# Patient Record
Sex: Male | Born: 1950 | Race: Black or African American | Hispanic: No | Marital: Single | Smoking: Former smoker
Health system: Southern US, Community
[De-identification: ages and names within clinical notes are randomized; demographics above are authoritative.]

## PROBLEM LIST (undated history)

## (undated) DIAGNOSIS — G9389 Other specified disorders of brain: Secondary | ICD-10-CM

## (undated) DIAGNOSIS — N1831 Chronic kidney disease, stage 3a: Secondary | ICD-10-CM

## (undated) DIAGNOSIS — R519 Headache, unspecified: Secondary | ICD-10-CM

## (undated) DIAGNOSIS — F17200 Nicotine dependence, unspecified, uncomplicated: Secondary | ICD-10-CM

## (undated) DIAGNOSIS — D126 Benign neoplasm of colon, unspecified: Secondary | ICD-10-CM

## (undated) DIAGNOSIS — I10 Essential (primary) hypertension: Secondary | ICD-10-CM

## (undated) DIAGNOSIS — K862 Cyst of pancreas: Secondary | ICD-10-CM

## (undated) DIAGNOSIS — E7800 Pure hypercholesterolemia, unspecified: Secondary | ICD-10-CM

## (undated) DIAGNOSIS — K861 Other chronic pancreatitis: Secondary | ICD-10-CM

## (undated) HISTORY — DX: Other chronic pancreatitis: K86.1

## (undated) HISTORY — DX: Cyst of pancreas: K86.2

## (undated) HISTORY — DX: Nicotine dependence, unspecified, uncomplicated: F17.200

## (undated) HISTORY — DX: Pure hypercholesterolemia, unspecified: E78.00

---

## 2007-10-25 ENCOUNTER — Emergency Department: Payer: Self-pay | Admitting: Emergency Medicine

## 2010-12-21 ENCOUNTER — Emergency Department: Payer: Self-pay | Admitting: Emergency Medicine

## 2013-07-22 ENCOUNTER — Ambulatory Visit: Payer: Self-pay | Admitting: Family Medicine

## 2017-06-05 ENCOUNTER — Other Ambulatory Visit: Payer: Self-pay | Admitting: Nurse Practitioner

## 2017-06-05 DIAGNOSIS — Z136 Encounter for screening for cardiovascular disorders: Secondary | ICD-10-CM

## 2017-06-05 DIAGNOSIS — Z87891 Personal history of nicotine dependence: Secondary | ICD-10-CM

## 2017-06-18 ENCOUNTER — Ambulatory Visit
Admission: RE | Admit: 2017-06-18 | Discharge: 2017-06-18 | Disposition: A | Discharge status: Home / Self Care | Payer: Medicare HMO | Source: Ambulatory Visit | Attending: Nurse Practitioner | Admitting: Nurse Practitioner

## 2017-06-18 DIAGNOSIS — Z136 Encounter for screening for cardiovascular disorders: Secondary | ICD-10-CM

## 2017-06-18 DIAGNOSIS — Z87891 Personal history of nicotine dependence: Secondary | ICD-10-CM

## 2019-05-12 ENCOUNTER — Ambulatory Visit: Payer: Self-pay | Attending: Internal Medicine

## 2019-05-12 DIAGNOSIS — Z23 Encounter for immunization: Secondary | ICD-10-CM | POA: Insufficient documentation

## 2019-05-12 NOTE — Progress Notes (Signed)
   Covid-19 Vaccination Clinic  Name:  Patrick Mack    MRN: [id number] DOB: 03/15/1951  05/12/2019  [name] was observed post Covid-19 immunization for 15 minutes without incidence. He was provided with Vaccine Information Sheet and instruction to access the V-Safe system.   [name] was instructed to call 911 with any severe reactions post vaccine: Marland Kitchen Difficulty breathing  . Swelling of your face and throat  . A fast heartbeat  . A bad rash all over your body  . Dizziness and weakness    Immunizations Administered    Name Date Dose VIS Date Route   Pfizer COVID-19 Vaccine 05/12/2019 11:15 AM 0.3 mL 02/25/2019 Intramuscular   Manufacturer: [place]   Lot: [id number]   [place]: [id number]

## 2019-06-01 ENCOUNTER — Ambulatory Visit: Payer: Self-pay | Attending: Internal Medicine

## 2019-06-01 DIAGNOSIS — Z23 Encounter for immunization: Secondary | ICD-10-CM

## 2019-06-01 NOTE — Progress Notes (Signed)
   Covid-19 Vaccination Clinic  Name:  Patrick Mack    MRN: [id number] DOB: [birth year]  06/01/2019  [name] was observed post Covid-19 immunization for 15 minutes without incident. He was provided with Vaccine Information Sheet and instruction to access the V-Safe system.   [name] was instructed to call 911 with any severe reactions post vaccine: Marland Kitchen Difficulty breathing  . Swelling of face and throat  . A fast heartbeat  . A bad rash all over body  . Dizziness and weakness   Immunizations Administered    Name Date Dose VIS Date Route   Pfizer COVID-19 Vaccine 06/01/2019 12:52 PM 0.3 mL 02/25/2019 Intramuscular   Manufacturer: [place]   Lot: [id number]   [place]: [id number]

## 2019-12-26 ENCOUNTER — Encounter (HOSPITAL_COMMUNITY): Payer: Self-pay | Admitting: Gastroenterology

## 2019-12-26 ENCOUNTER — Other Ambulatory Visit: Payer: Self-pay | Admitting: Gastroenterology

## 2019-12-26 ENCOUNTER — Other Ambulatory Visit: Payer: Self-pay

## 2019-12-27 ENCOUNTER — Other Ambulatory Visit (HOSPITAL_COMMUNITY)
Admission: RE | Admit: 2019-12-27 | Discharge: 2019-12-27 | Disposition: A | Discharge status: Home / Self Care | Payer: Medicare HMO | Source: Ambulatory Visit | Attending: Gastroenterology | Admitting: Gastroenterology

## 2019-12-27 DIAGNOSIS — Z01812 Encounter for preprocedural laboratory examination: Secondary | ICD-10-CM | POA: Insufficient documentation

## 2019-12-27 DIAGNOSIS — Z20822 Contact with and (suspected) exposure to covid-19: Secondary | ICD-10-CM | POA: Insufficient documentation

## 2019-12-27 LAB — SARS CORONAVIRUS 2 (TAT 6-24 HRS): SARS Coronavirus 2: NEGATIVE

## 2019-12-30 ENCOUNTER — Other Ambulatory Visit: Payer: Self-pay

## 2019-12-30 ENCOUNTER — Encounter (HOSPITAL_COMMUNITY)
Admission: RE | Disposition: A | Discharge status: Home / Self Care | Payer: Self-pay | Source: Home / Self Care | Attending: Gastroenterology

## 2019-12-30 ENCOUNTER — Ambulatory Visit (HOSPITAL_COMMUNITY): Payer: Medicare HMO | Admitting: Anesthesiology

## 2019-12-30 ENCOUNTER — Encounter (HOSPITAL_COMMUNITY): Payer: Self-pay | Admitting: Gastroenterology

## 2019-12-30 ENCOUNTER — Ambulatory Visit (HOSPITAL_COMMUNITY)
Admission: RE | Admit: 2019-12-30 | Discharge: 2019-12-30 | Disposition: A | Discharge status: Home / Self Care | Payer: Medicare HMO | Attending: Gastroenterology | Admitting: Gastroenterology

## 2019-12-30 DIAGNOSIS — N1831 Chronic kidney disease, stage 3a: Secondary | ICD-10-CM | POA: Diagnosis not present

## 2019-12-30 DIAGNOSIS — Q6101 Congenital single renal cyst: Secondary | ICD-10-CM | POA: Insufficient documentation

## 2019-12-30 DIAGNOSIS — I129 Hypertensive chronic kidney disease with stage 1 through stage 4 chronic kidney disease, or unspecified chronic kidney disease: Secondary | ICD-10-CM | POA: Diagnosis not present

## 2019-12-30 DIAGNOSIS — K8689 Other specified diseases of pancreas: Secondary | ICD-10-CM | POA: Insufficient documentation

## 2019-12-30 DIAGNOSIS — Z87891 Personal history of nicotine dependence: Secondary | ICD-10-CM | POA: Insufficient documentation

## 2019-12-30 HISTORY — DX: Other specified disorders of brain: G93.89

## 2019-12-30 HISTORY — DX: Essential (primary) hypertension: I10

## 2019-12-30 HISTORY — PX: UPPER ESOPHAGEAL ENDOSCOPIC ULTRASOUND (EUS): SHX6562

## 2019-12-30 HISTORY — DX: Headache, unspecified: R51.9

## 2019-12-30 HISTORY — DX: Benign neoplasm of colon, unspecified: D12.6

## 2019-12-30 HISTORY — DX: Chronic kidney disease, stage 3a: N18.31

## 2019-12-30 HISTORY — PX: ESOPHAGOGASTRODUODENOSCOPY (EGD) WITH PROPOFOL: SHX5813

## 2019-12-30 HISTORY — PX: FINE NEEDLE ASPIRATION: SHX5430

## 2019-12-30 SURGERY — ESOPHAGOGASTRODUODENOSCOPY (EGD) WITH PROPOFOL
Anesthesia: Monitor Anesthesia Care

## 2019-12-30 MED ORDER — PROPOFOL 10 MG/ML IV BOLUS: INTRAVENOUS | Status: DC | PRN

## 2019-12-30 MED ORDER — SODIUM CHLORIDE 0.9 % IV SOLN: INTRAVENOUS | Status: DC

## 2019-12-30 MED ORDER — FENTANYL CITRATE (PF) 100 MCG/2ML IJ SOLN
INTRAMUSCULAR | Status: DC | PRN
  Administered 2019-12-30: 25 ug via INTRAVENOUS

## 2019-12-30 MED ORDER — LACTATED RINGERS IV SOLN: INTRAVENOUS | Status: DC | PRN

## 2019-12-30 MED ORDER — PROPOFOL 10 MG/ML IV BOLUS
INTRAVENOUS | Status: DC | PRN
  Administered 2019-12-30 (×2): 30 mg via INTRAVENOUS

## 2019-12-30 MED ORDER — FENTANYL CITRATE (PF) 100 MCG/2ML IJ SOLN
INTRAMUSCULAR | Status: AC
Start: 2019-12-30 — End: ?
  Filled 2019-12-30: qty 2

## 2019-12-30 MED ORDER — CIPROFLOXACIN IN D5W 400 MG/200ML IV SOLN
INTRAVENOUS | Status: AC
  Filled 2019-12-30: qty 200

## 2019-12-30 MED ORDER — PROPOFOL 500 MG/50ML IV EMUL
INTRAVENOUS | Status: DC | PRN
  Administered 2019-12-30: 150 ug/kg/min via INTRAVENOUS

## 2019-12-30 MED ORDER — CIPROFLOXACIN IN D5W 400 MG/200ML IV SOLN
INTRAVENOUS | Status: DC | PRN
  Administered 2019-12-30: 400 mg via INTRAVENOUS

## 2019-12-30 MED ORDER — PHENYLEPHRINE HCL (PRESSORS) 10 MG/ML IV SOLN
INTRAVENOUS | Status: DC | PRN
  Administered 2019-12-30 (×3): 80 ug via INTRAVENOUS

## 2019-12-30 NOTE — Anesthesia Procedure Notes (Signed)
Procedure Name: MAC Date/Time: 12/30/2019 2:01 PM Performed by: Raenette Rover, CRNA Pre-anesthesia Checklist: Patient identified, Emergency Drugs available, Suction available and Patient being monitored Patient Re-evaluated:Patient Re-evaluated prior to induction Oxygen Delivery Method: Simple face mask

## 2019-12-30 NOTE — Anesthesia Postprocedure Evaluation (Signed)
Anesthesia Post Note  Patient: Patrick Mack  Procedure(s) Performed: UPPER ESOPHAGEAL ENDOSCOPIC ULTRASOUND (EUS) (N/A ) ESOPHAGOGASTRODUODENOSCOPY (EGD) WITH PROPOFOL (N/A ) FINE NEEDLE ASPIRATION (FNA) LINEAR (N/A )     Patient location during evaluation: PACU Anesthesia Type: MAC Level of consciousness: awake and alert Pain management: pain level controlled Vital Signs Assessment: post-procedure vital signs reviewed and stable Respiratory status: spontaneous breathing, nonlabored ventilation, respiratory function stable and patient connected to nasal cannula oxygen Cardiovascular status: stable and blood pressure returned to baseline Postop Assessment: no apparent nausea or vomiting Anesthetic complications: no   No complications documented.  Last Vitals:  Vitals:   12/30/19 1500 12/30/19 1510  BP: 112/85 110/61  Pulse: 79 62  Resp: (!) 22 (!) 21  Temp:    SpO2: 95% 100%    Last Pain:  Vitals:   12/30/19 1510  TempSrc:   PainSc: 0-No pain                 Jaylianna Tatlock DAVID

## 2019-12-30 NOTE — Anesthesia Preprocedure Evaluation (Addendum)
Anesthesia Evaluation  Patient identified by MRN, date of birth, ID band Patient awake    Reviewed: Allergy & Precautions, NPO status , Patient's Chart, lab work & pertinent test results  Airway Mallampati: I  TM Distance: >3 FB Neck ROM: Full    Dental   Pulmonary neg pulmonary ROS,    Pulmonary exam normal        Cardiovascular hypertension, Pt. on medications Normal cardiovascular exam     Neuro/Psych  Headaches, negative psych ROS   GI/Hepatic Neg liver ROS,   Endo/Other  negative endocrine ROS  Renal/GU Renal InsufficiencyRenal diseaseCKD III  negative genitourinary   Musculoskeletal   Abdominal   Peds  Hematology   Anesthesia Other Findings   Reproductive/Obstetrics                            Anesthesia Physical Anesthesia Plan  ASA: II  Anesthesia Plan: MAC   Post-op Pain Management:    Induction:   PONV Risk Score and Plan: 1  Airway Management Planned: Natural Airway and Mask  Additional Equipment: None  Intra-op Plan:   Post-operative Plan:   Informed Consent: I have reviewed the patients History and Physical, chart, labs and discussed the procedure including the risks, benefits and alternatives for the proposed anesthesia with the patient or authorized representative who has indicated his/her understanding and acceptance.       Plan Discussed with: CRNA and Surgeon  Anesthesia Plan Comments:        Anesthesia Quick Evaluation

## 2019-12-30 NOTE — Op Note (Signed)
[facility] Patient Name: Patrick Mack Procedure Date: 12/30/2019 MRN: [id number] Attending MD: Carol Ada , MD Date of Birth: Aug 28, 1950 CSN: [id number] Age: 69 Admit Type: Outpatient Procedure:                Upper EUS Indications:              Dilated pancreatic duct on CT scan Providers:                Carol Ada, MD, Baird Cancer, RN, Cherylynn Ridges,                            Technician, Hedy Camara CRNA Referring MD:              Medicines:                Propofol per Anesthesia Complications:            No immediate complications. Estimated Blood Loss:     Estimated blood loss: none. Procedure:                Pre-Anesthesia Assessment:                           - Prior to the procedure, a History and Physical                            was performed, and patient medications and                            allergies were reviewed. The patient's tolerance of                            previous anesthesia was also reviewed. The risks                            and benefits of the procedure and the sedation                            options and risks were discussed with the patient.                            All questions were answered, and informed consent                            was obtained. Prior Anticoagulants: The patient has                            taken no previous anticoagulant or antiplatelet                            agents. ASA Grade Assessment: II - A patient with                            mild systemic disease. After reviewing the risks  and benefits, the patient was deemed in                            satisfactory condition to undergo the procedure.                           - Sedation was administered by an anesthesia                            professional. Deep sedation was attained.                           After obtaining informed consent, the endoscope was                            passed under  direct vision. Throughout the                            procedure, the patient's blood pressure, pulse, and                            oxygen saturations were monitored continuously. The                            [id number] ([id number] Olympus Linear EUS was                            introduced through the mouth, and advanced to the                            second part of duodenum. The upper EUS was                            accomplished without difficulty. The patient                            tolerated the procedure well. Scope In: Scope Out: Findings:      ENDOSONOGRAPHIC FINDING: :      An anechoic lesion suggestive of a cyst was identified in the pancreatic       head and uncinate process of the pancreas. It is not in obvious       communication with the pancreatic duct. The lesion measured 13 mm by 18       mm in maximal cross-sectional diameter. There was a single compartment       without septae. The outer wall of the lesion was thin. There was no       associated mass. There was internal debris within the fluid-filled       cavity. Diagnostic needle aspiration for fluid was performed. Color       Doppler imaging was utilized prior to needle puncture to confirm a lack       of significant vascular structures within the needle path. One pass was       made with the 22 gauge needle using a transduodenal approach. No stylet       was used. The amount of  fluid collected was 1 mL. The fluid was clear,       white and thin. Sample(s) were sent for cytology and CEA.      The pancreatic duct had a dilated endosonographic appearance and had       intraductal stones in the pancreatic head, body of the pancreas and side       branches of the pancreatic duct. The pancreatic duct measured up to 7 mm       in diameter.      Pancreatic parenchymal abnormalities were noted in the pancreatic head,       pancreatic body and uncinate process of the pancreas. These consisted of       atrophy,  calcifications, diffuse echogenicity and lobularity.      An anechoic lesion suggestive of a cyst was identified in the left       kidney. There was a single compartment without septae. The outer wall of       the lesion was thin. There was no associated mass. There was no internal       debris within the fluid-filled cavity.      The head, uncinate, and body of the pancreas was hypoechic and lobular.       The tail of the pancreas appeared to be normal, but it was difficult to       obtain a complete view. The PD was markedly dilated in the head, neck,       and the body of the pancreas. Two proximal PD stones were identified and       one appeared to be intraductal. There were dilated side banches. The       luminal view of the ampulla was briefly visualized, but there was no       evidence of any mucus extruding from the ampullary orifice to suggest an       IPMN. There was no evidence of any masses in the head of the pancreas or       uncinate process, but an 18 x 13 mm cyst was identified. There appeared       to be some sediment in the cyst, but no evidence of any nodularity or       associated masses. The fluid was easily aspirated and it appeared thin       and clear. The cyst was completely collapsed. Cipro 400 mg IV was       provided to the patient at the time of the fluid aspiration. The CBD was       normal in caliber measuring 3 mm and the gallbladder was distended       without stones or sludge. A benign cyst was found in the left kidney. Impression:               - A cystic lesion was seen in the pancreatic head                            and uncinate process of the pancreas. Fine needle                            aspiration for fluid performed.                           - The pancreatic duct had a dilated endosonographic  appearance and had intraductal stones in the                            pancreatic head, body of the pancreas and side                             branches of the pancreatic duct. The pancreatic                            duct measured up to 7 mm in diameter.                           - Pancreatic parenchymal abnormalities consisting                            of atrophy, calcifications, diffuse echogenicity                            and lobularity were noted in the pancreatic head,                            pancreatic body and uncinate process of the                            pancreas.                           - A cystic lesion was identified in the left kidney. Moderate Sedation:      Not Applicable - Patient had care per Anesthesia. Recommendation:           - Patient has a contact number available for                            emergencies. The signs and symptoms of potential                            delayed complications were discussed with the                            patient. Return to normal activities tomorrow.                            Written discharge instructions were provided to the                            patient.                           - Resume regular diet.                           - Await cytology results, tumor marker (CEA), and                            amylase.                           -  Complete the three day course of Cipro. Procedure Code(s):        --- Professional ---                           [phone], Esophagogastroduodenoscopy, flexible,                            transoral; with transendoscopic ultrasound-guided                            intramural or transmural fine needle                            aspiration/biopsy(s), (includes endoscopic                            ultrasound examination limited to the esophagus,                            stomach or duodenum, and adjacent structures) Diagnosis Code(s):        --- Professional ---                           X43.5, Cyst of pancreas                           K86.9, Disease of pancreas, unspecified                            N28.89, Other specified disorders of kidney and                            ureter                           K86.89, Other specified diseases of pancreas                           R93.3, Abnormal findings on diagnostic imaging of                            other parts of digestive tract CPT copyright 2019 [employer]. All rights reserved. The codes documented in this report are preliminary and upon coder review may  be revised to meet current compliance requirements. Carol Ada, MD Carol Ada, MD 12/30/2019 3:02:33 PM This report has been signed electronically. Number of Addenda: 0

## 2019-12-30 NOTE — Discharge Instructions (Signed)
Upper Endoscopy, Adult, Care After  This sheet gives you information about how to care for yourself after your procedure. Your health care provider may also give you more specific instructions. If you have problems or questions, contact your health care provider.  What can I expect after the procedure?  After the procedure, it is common to have:  · A sore throat.  · Mild stomach pain or discomfort.  · Bloating.  · Nausea.  Follow these instructions at home:    · Follow instructions from your health care provider about what to eat or drink after your procedure.  · Return to your normal activities as told by your health care provider. Ask your health care provider what activities are safe for you.  · Take over-the-counter and prescription medicines only as told by your health care provider.  · Do not drive for 24 hours if you were given a sedative during your procedure.  · Keep all follow-up visits as told by your health care provider. This is important.  Contact a health care provider if you have:  · A sore throat that lasts longer than one day.  · Trouble swallowing.  Get help right away if:  · You vomit blood or your vomit looks like coffee grounds.  · You have:  ? A fever.  ? Bloody, black, or tarry stools.  ? A severe sore throat or you cannot swallow.  ? Difficulty breathing.  ? Severe pain in your chest or abdomen.  Summary  · After the procedure, it is common to have a sore throat, mild stomach discomfort, bloating, and nausea.  · Do not drive for 24 hours if you were given a sedative during the procedure.  · Follow instructions from your health care provider about what to eat or drink after your procedure.  · Return to your normal activities as told by your health care provider.  This information is not intended to replace advice given to you by your health care provider. Make sure you discuss any questions you have with your health care provider.  Document Revised: 08/25/2017 Document Reviewed:  08/03/2017  Elsevier Patient Education © 2020 [employer]

## 2019-12-30 NOTE — H&P (Signed)
  Patrick Mack HPI: For the past couple of months the patient noticed a change in his bowel habits. Previously he was having a formed bowel movement once per day, but now it is inconsistent. He thought that he was going to have better bowel movements when he stopped smoking cigars and drinking ETOH. 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 He only started drinking for the past 5 years, but it accelerated in the past 2-3 years. Improving his habits and exercising allowed him to lose weight. In mid August he experienced a severe headache and a painful erection, which woke him up from sleep. He presented to Urgent Care, but he was promptly sent to the ER for his hypertension. The patient underwent a CT scan of his head and an abnormality was noted. He was referred to Dr. Annamaria Boots and an MRI of the brain was obtained (11/24/2019) with findings of some lesions. Per his report, Dr. 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 Many years ago, when he was working in [employer], he smoked a 1/2 pack per day for 5 years. The recent smoking consisted of smoking cigars. A screening colonoscopy was performed on 06/11/2017 at [facility] with findings of 3 small adenomas. The patient denies any issues with chest pain, GERD, diarrhea, hematochezia, melena, or dysphagia. Further of imaging of his chest/ABD/pelvis reveals a pancreatic uncinate process mass.   Past Medical History:  Diagnosis Date  . Brain mass   . Headache   . Hypertension   . Stage 3a chronic kidney disease ([place])   . Tubular adenoma of colon       No family history on file.  Social History:  has no history on file for tobacco use, alcohol use, and drug use.  Allergies: No Known Allergies  Medications:  Scheduled:  Continuous: . sodium chloride      No results found for this or any previous visit (from the past 24 hour(s)).   No results found.  ROS:  As stated above in the HPI  otherwise negative.  Blood pressure 138/86, pulse 74, temperature 98.6 F (37 C), temperature source Oral, resp. rate 14, height 5\' 11"  (1.803 m), weight 75.3 kg, SpO2 98 %.    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  Patrick Mack 12/30/2019, 1:19 PM

## 2019-12-30 NOTE — Transfer of Care (Addendum)
Immediate Anesthesia Transfer of Care Note  Patient: ADAIAH MORKEN  Procedure(s) Performed: UPPER ESOPHAGEAL ENDOSCOPIC ULTRASOUND (EUS) (N/A ) ESOPHAGOGASTRODUODENOSCOPY (EGD) WITH PROPOFOL (N/A ) FINE NEEDLE ASPIRATION (FNA) LINEAR (N/A )  Patient Location: Endoscopy Unit  Anesthesia Type:MAC  Level of Consciousness: awake, alert  and patient cooperative  Airway & Oxygen Therapy: Patient Spontanous Breathing and Patient connected to face mask oxygen  Post-op Assessment: Report given to RN and Post -op Vital signs reviewed and stable  Post vital signs: Reviewed and stable  Last Vitals:  Vitals Value Taken Time  BP 111/72   Temp    Pulse 71 12/30/19 1449  Resp 19 12/30/19 1449  SpO2 100 % 12/30/19 1449  Vitals shown include unvalidated device data.  Last Pain:  Vitals:   12/30/19 1304  TempSrc: Oral  PainSc: 0-No pain         Complications: No complications documented.

## 2020-01-02 ENCOUNTER — Encounter (HOSPITAL_COMMUNITY): Payer: Self-pay | Admitting: Gastroenterology

## 2020-01-03 LAB — CYTOLOGY - NON PAP

## 2020-02-02 ENCOUNTER — Emergency Department (HOSPITAL_COMMUNITY): Payer: Medicare HMO

## 2020-02-02 ENCOUNTER — Encounter (HOSPITAL_COMMUNITY): Payer: Self-pay | Admitting: Emergency Medicine

## 2020-02-02 ENCOUNTER — Emergency Department (HOSPITAL_COMMUNITY)
Admission: EM | Admit: 2020-02-02 | Discharge: 2020-02-02 | Disposition: A | Discharge status: Home / Self Care | Payer: Medicare HMO | Attending: Emergency Medicine | Admitting: Emergency Medicine

## 2020-02-02 DIAGNOSIS — Z79899 Other long term (current) drug therapy: Secondary | ICD-10-CM | POA: Diagnosis not present

## 2020-02-02 DIAGNOSIS — R109 Unspecified abdominal pain: Secondary | ICD-10-CM | POA: Diagnosis present

## 2020-02-02 DIAGNOSIS — I129 Hypertensive chronic kidney disease with stage 1 through stage 4 chronic kidney disease, or unspecified chronic kidney disease: Secondary | ICD-10-CM | POA: Insufficient documentation

## 2020-02-02 DIAGNOSIS — N1831 Chronic kidney disease, stage 3a: Secondary | ICD-10-CM | POA: Insufficient documentation

## 2020-02-02 DIAGNOSIS — Z7982 Long term (current) use of aspirin: Secondary | ICD-10-CM | POA: Diagnosis not present

## 2020-02-02 DIAGNOSIS — K859 Acute pancreatitis without necrosis or infection, unspecified: Secondary | ICD-10-CM

## 2020-02-02 LAB — COMPREHENSIVE METABOLIC PANEL
ALT: 13 U/L (ref 0–44)
AST: 16 U/L (ref 15–41)
Albumin: 3.9 g/dL (ref 3.5–5.0)
Alkaline Phosphatase: 90 U/L (ref 38–126)
Anion gap: 9 (ref 5–15)
BUN: 10 mg/dL (ref 8–23)
CO2: 22 mmol/L (ref 22–32)
Calcium: 9.3 mg/dL (ref 8.9–10.3)
Chloride: 106 mmol/L (ref 98–111)
Creatinine, Ser: 1.23 mg/dL (ref 0.61–1.24)
GFR, Estimated: >60 mL/min (ref >60)
Glucose, Bld: 101 mg/dL — ABNORMAL HIGH (ref 70–99)
Potassium: 3.9 mmol/L (ref 3.5–5.1)
Sodium: 137 mmol/L (ref 135–145)
Total Bilirubin: 0.7 mg/dL (ref 0.3–1.2)
Total Protein: 6.3 g/dL — ABNORMAL LOW (ref 6.5–8.1)

## 2020-02-02 LAB — URINALYSIS, ROUTINE W REFLEX MICROSCOPIC
Bilirubin Urine: NEGATIVE
Glucose, UA: NEGATIVE mg/dL
Hgb urine dipstick: NEGATIVE
Ketones, ur: NEGATIVE mg/dL
Leukocytes,Ua: NEGATIVE
Nitrite: NEGATIVE
Protein, ur: NEGATIVE mg/dL
Specific Gravity, Urine: 1.008 (ref 1.005–1.030)
pH: 7 (ref 5.0–8.0)

## 2020-02-02 LAB — CBC
HCT: 40.6 % (ref 39.0–52.0)
Hemoglobin: 13.9 g/dL (ref 13.0–17.0)
MCH: 32.7 pg (ref 26.0–34.0)
MCHC: 34.2 g/dL (ref 30.0–36.0)
MCV: 95.5 fL (ref 80.0–100.0)
Platelets: 353 10*3/uL (ref 150–400)
RBC: 4.25 MIL/uL (ref 4.22–5.81)
RDW: 14.8 % (ref 11.5–15.5)
WBC: 10.5 10*3/uL (ref 4.0–10.5)
nRBC: 0 % (ref 0.0–0.2)

## 2020-02-02 LAB — LIPASE, BLOOD: Lipase: 103 U/L — ABNORMAL HIGH (ref 11–51)

## 2020-02-02 MED ORDER — OXYCODONE-ACETAMINOPHEN 5-325 MG PO TABS
1.0000 | ORAL_TABLET | Freq: Four times a day (QID) | ORAL | 0 refills | Status: DC | PRN
Start: 2020-02-02 — End: 2020-07-21

## 2020-02-02 MED ORDER — SODIUM CHLORIDE 0.9 % IV BOLUS
1000.0000 mL | Freq: Once | INTRAVENOUS | Status: AC
  Administered 2020-02-02: 1000 mL via INTRAVENOUS

## 2020-02-02 MED ORDER — IOHEXOL 300 MG/ML  SOLN
100.0000 mL | Freq: Once | INTRAMUSCULAR | Status: AC | PRN
  Administered 2020-02-02: 100 mL via INTRAVENOUS

## 2020-02-02 NOTE — Discharge Instructions (Signed)
Pick up pain medication and take as needed Drink plenty of fluids to stay hydrated Follow up with Dr. Benson Norway as scheduled for Monday. You may need additional imaging or labwork for further evaluation of your pain.   Return to the ED IMMEDIATELY for any worsening symptoms including worsening pain, excessive vomiting, fevers > 100.4, or any other new/concerning symptoms

## 2020-02-02 NOTE — ED Provider Notes (Signed)
[facility] EMERGENCY DEPARTMENT Provider Note   CSN: [id number] Arrival date & time: 02/02/20  1115     History Chief Complaint  Patient presents with  . Abdominal Pain    Patrick Mack is a 69 y.o. male with PMHx HTN, CKD stage III who presents to the ED today with complaint of gradual onset, constant, worsening, diffuse abdominal pain x 1-2 weeks. Pt reports he has been having this abdominal pain intermittently and recently saw a GI doctor with an EGD with findings of ?pancreatic cyst. Unable to see EGD records. Pt reports that the pain became worse the past 1-2 weeks and unbarable the past few days. He called his GI doctor who told him to go to [facility] yesterday to have labs done - he was called this morning stating that he had pancreatitis and needed to go to the ED for further evaluation. Pt was given 100 mcg fentanyl en route with good relief of his pain. He does mention having some mild diarrhea the past He denies fevers, chills, nausea, vomiting, constipation, or any other associated symptoms. No previous abdominal surgeries.    The history is provided by the patient and medical records.       Past Medical History:  Diagnosis Date  . Brain mass   . Headache   . Hypertension   . Stage 3a chronic kidney disease ([place])   . Tubular adenoma of colon     There are no problems to display for this patient.   Past Surgical History:  Procedure Laterality Date  . ESOPHAGOGASTRODUODENOSCOPY (EGD) WITH PROPOFOL N/A 12/30/2019   Procedure: ESOPHAGOGASTRODUODENOSCOPY (EGD) WITH PROPOFOL;  Surgeon: Carol Ada, MD;  Location: [facility];  Service: Endoscopy;  Laterality: N/A;  . FINE NEEDLE ASPIRATION N/A 12/30/2019   Procedure: FINE NEEDLE ASPIRATION (FNA) LINEAR;  Surgeon: Carol Ada, MD;  Location: [facility];  Service: Endoscopy;  Laterality: N/A;  . UPPER ESOPHAGEAL ENDOSCOPIC ULTRASOUND (EUS) N/A 12/30/2019   Procedure: UPPER ESOPHAGEAL ENDOSCOPIC  ULTRASOUND (EUS);  Surgeon: Carol Ada, MD;  Location: [facility];  Service: Endoscopy;  Laterality: N/A;       No family history on file.  Social History   Tobacco Use  . Smoking status: Not on file  Substance Use Topics  . Alcohol use: Not on file  . Drug use: Not on file    Home Medications Prior to Admission medications   Medication Sig Start Date End Date Taking? Authorizing Provider  ascorbic acid (VITAMIN C) 500 MG tablet Take 500 mg by mouth daily.   Yes [provider]  aspirin 81 MG chewable tablet Chew 81 mg by mouth every morning.   Yes [provider]  atorvastatin (LIPITOR) 80 MG tablet Take 80 mg by mouth every morning.   Yes [provider]  Cholecalciferol (VITAMIN D3) 10 MCG (400 UNIT) CAPS Take 400 Units by mouth daily.    Yes [provider]  gabapentin (NEURONTIN) 300 MG capsule Take 300 mg by mouth at bedtime.   Yes [provider]  hydrochlorothiazide (HYDRODIURIL) 25 MG tablet Take 25 mg by mouth every morning.   Yes [provider]  lisinopril (ZESTRIL) 20 MG tablet Take 20 mg by mouth every morning.   Yes [provider]  rizatriptan (MAXALT-MLT) 10 MG disintegrating tablet Take 10 mg by mouth as needed for migraine. May repeat in 2 hours if needed   Yes [provider]  VITAMIN D PO Take 1 tablet by mouth daily.  Yes [provider]  zinc gluconate 50 MG tablet Take 50 mg by mouth every morning.   Yes [provider]  oxyCODONE-acetaminophen (PERCOCET/ROXICET) 5-325 MG tablet Take 1 tablet by mouth every 6 (six) hours as needed for severe pain. 02/02/20   Eustaquio Maize, PA-C    Allergies    Patient has no known allergies.  Review of Systems   Review of Systems  Constitutional: Negative for chills and fever.  Gastrointestinal: Positive for abdominal pain. Negative for constipation, diarrhea and vomiting.  All other systems reviewed and are  negative.   Physical Exam Updated Vital Signs BP (!) 158/97   Pulse 71   Temp 98.7 F (37.1 C) (Oral)   Resp 12   Ht 5\' 11"  (1.803 m)   Wt 75.8 kg   SpO2 100%   BMI 23.29 kg/m   Physical Exam Vitals and nursing note reviewed.  Constitutional:      Appearance: He is not ill-appearing or diaphoretic.  HENT:     Head: Normocephalic and atraumatic.  Eyes:     Conjunctiva/sclera: Conjunctivae normal.  Cardiovascular:     Rate and Rhythm: Normal rate and regular rhythm.  Pulmonary:     Effort: Pulmonary effort is normal.     Breath sounds: Normal breath sounds. No wheezing, rhonchi or rales.  Abdominal:     Palpations: Abdomen is soft.     Tenderness: There is generalized abdominal tenderness and tenderness in the left upper quadrant.  Musculoskeletal:     Cervical back: Neck supple.  Skin:    General: Skin is warm and dry.  Neurological:     Mental Status: He is alert.     ED Results / Procedures / Treatments   Labs (all labs ordered are listed, but only abnormal results are displayed) Labs Reviewed  COMPREHENSIVE METABOLIC PANEL - Abnormal; Notable for the following components:      Result Value   Glucose, Bld 101 (*)    Total Protein 6.3 (*)    All other components within normal limits  LIPASE, BLOOD - Abnormal; Notable for the following components:   Lipase 103 (*)    All other components within normal limits  CBC  URINALYSIS, ROUTINE W REFLEX MICROSCOPIC    EKG None  Radiology CT Abdomen Pelvis W Contrast  Result Date: 02/02/2020 CLINICAL DATA:  Abdominal pain, acute, nonlocalized EXAM: CT ABDOMEN AND PELVIS WITH CONTRAST TECHNIQUE: Multidetector CT imaging of the abdomen and pelvis was performed using the standard protocol following bolus administration of intravenous contrast. CONTRAST:  129mL OMNIPAQUE IOHEXOL 300 MG/ML  SOLN COMPARISON:  None FINDINGS: Lower chest: Lung bases are clear. Hepatobiliary: Hypodense lesion in the LEFT hepatic lobe favored  benign cysts measuring 8 mm on image 13/3. No biliary duct dilatation. Gallbladder normal. Common bile duct measures 5 mm. Pancreas: There is several coarse calcifications in the head of the pancreas. 1 calcification is positioned in the pancreatic duct at the junction of the pancreatic duct and the ampulla. There is dilatation of the pancreatic duct measuring 7 mm in the pancreatic head and 6 mm in the body and tail. There is mild inflammation in the retroperitoneal fat adjacent the pancreatic head. The findings are suggestive of acute on chronic pancreatitis. (Image 33/3 and image 71/6) Spleen: Normal spleen Adrenals/urinary tract: Adrenal glands normal. There bilateral nonenhancing renal cysts. Stomach/Bowel: Stomach, small bowel appendix and cecum normal. The colon and rectosigmoid colon are normal. Vascular/Lymphatic: Abdominal aorta is normal caliber with atherosclerotic calcification. There is  no retroperitoneal or periportal lymphadenopathy. No pelvic lymphadenopathy. Reproductive: Prostate mildly enlarged at 56 mm Other: No free fluid. Musculoskeletal: No aggressive osseous lesion. IMPRESSION: 1. Coarse calcification the pancreatic head with pancreatic duct dilatation and mild inflammation in the peripancreatic retroperitoneal fat. Findings are most consistent with acute on chronic mild pancreatitis. 2. No biliary duct dilatation identified. 3. Recommend correlation with CA 19 9 and consider follow-up contrast abdominal CT or contrast abdominal MRI to exclude unlikely underlying pancreatic neoplasm. Electronically Signed   By: Suzy Bouchard M.D.   On: 02/02/2020 15:41    Procedures Procedures (including critical care time)  Medications Ordered in ED Medications  sodium chloride 0.9 % bolus 1,000 mL (1,000 mLs Intravenous New Bag/Given 02/02/20 1317)  iohexol (OMNIPAQUE) 300 MG/ML solution 100 mL (100 mLs Intravenous Contrast Given 02/02/20 1459)    ED Course  I have reviewed the triage vital  signs and the nursing notes.  Pertinent labs & imaging results that were available during my care of the patient were reviewed by me and considered in my medical decision making (see chart for details).    MDM Rules/Calculators/A&P                          69 year old male who presents to the ED today after being called by GI stating he had pancreatitis on labwork done yesterday. He has been seeing GI for intermittent abdominal pain with a heavy EtOH history. He had an EGD done 3 weeks ago and pt reports ?pancreatic cyst. He began having worsening pain this week and had labwork done at [place] yesterday and was called today and told to come to the ED. He received 100 mcg fentanyl en route with EMS with improvement in pain. On arrival VSS. Pt appears to be in NAD. He is in good spirits and quite talkative during exam. He has diffuse abdominal TTP however worse in the LUQ. In the setting of recent EGD with worsening pain will plan of CT scan however pt does have history of CKD stage III. Will need to check creatinine prior to obtaining CT scan. Will obtain labs and provide fluids at this time. Pt denies any nausea and states his pain is less severe currently; will hold on additional pain medicine.   CBC without leukocytosis. Hgb stable at 13.9 CMP with glucose 101; no other findings. Stable creatinine 1.23 with GFR > 60. LFTs unremarkable Lipase slightly elevated at 103. CT scan ordered at this time for further evaluation.   CT scan: IMPRESSION:  1. Coarse calcification the pancreatic head with pancreatic duct  dilatation and mild inflammation in the peripancreatic  retroperitoneal fat. Findings are most consistent with acute on  chronic mild pancreatitis.  2. No biliary duct dilatation identified.  3. Recommend correlation with CA 19 9 and consider follow-up  contrast abdominal CT or contrast abdominal MRI to exclude unlikely  underlying pancreatic neoplasm.   Discussed CT findings with  attending physician Dr. Regenia Skeeter; given pt is overall well appearing will plan to discharge home with GI follow up. Pt has an appointment on Monday. Will discharge with pain meds. Pt is in agreement with plan and ready to go home.   This note was prepared using Dragon voice recognition software and may include unintentional dictation errors due to the inherent limitations of voice recognition software.  Final Clinical Impression(s) / ED Diagnoses Final diagnoses:  Acute on chronic pancreatitis ([place])    Rx / DC  Orders ED Discharge Orders         Ordered    oxyCODONE-acetaminophen (PERCOCET/ROXICET) 5-325 MG tablet  Every 6 hours PRN        02/02/20 1601           Discharge Instructions     Pick up pain medication and take as needed Drink plenty of fluids to stay hydrated Follow up with Dr. Benson Norway as scheduled for Monday. You may need additional imaging or labwork for further evaluation of your pain.   Return to the ED IMMEDIATELY for any worsening symptoms including worsening pain, excessive vomiting, fevers > 100.4, or any other new/concerning symptoms       Eustaquio Maize, PA-C 02/02/20 1602    Sherwood Gambler, MD 02/04/20 [phone]

## 2020-02-02 NOTE — ED Triage Notes (Signed)
Pt arrives via EMS with intermittent abd pain for a few months but this episode lasting for a few days. 100 mcg fentanyl given by EMS. Pt diagnosed with pancreatitis this morning.

## 2020-04-06 ENCOUNTER — Ambulatory Visit: Payer: Medicare HMO | Admitting: Neurology

## 2020-05-10 ENCOUNTER — Encounter: Payer: Self-pay | Admitting: *Deleted

## 2020-05-15 ENCOUNTER — Ambulatory Visit: Payer: Medicare HMO | Admitting: Neurology

## 2020-07-19 ENCOUNTER — Emergency Department (HOSPITAL_COMMUNITY): Payer: Medicare HMO

## 2020-07-19 ENCOUNTER — Encounter (HOSPITAL_COMMUNITY): Payer: Self-pay

## 2020-07-19 ENCOUNTER — Other Ambulatory Visit: Payer: Self-pay

## 2020-07-19 ENCOUNTER — Inpatient Hospital Stay (HOSPITAL_COMMUNITY)
Admission: EM | Admit: 2020-07-19 | Discharge: 2020-07-21 | DRG: 444 | Disposition: A | Discharge status: Home / Self Care | Payer: Medicare HMO | Attending: Internal Medicine | Admitting: Internal Medicine

## 2020-07-19 DIAGNOSIS — K810 Acute cholecystitis: Principal | ICD-10-CM

## 2020-07-19 DIAGNOSIS — Z7982 Long term (current) use of aspirin: Secondary | ICD-10-CM | POA: Diagnosis not present

## 2020-07-19 DIAGNOSIS — K8043 Calculus of bile duct with acute cholecystitis with obstruction: Secondary | ICD-10-CM | POA: Diagnosis present

## 2020-07-19 DIAGNOSIS — K8050 Calculus of bile duct without cholangitis or cholecystitis without obstruction: Secondary | ICD-10-CM | POA: Diagnosis not present

## 2020-07-19 DIAGNOSIS — R1114 Bilious vomiting: Secondary | ICD-10-CM

## 2020-07-19 DIAGNOSIS — K861 Other chronic pancreatitis: Secondary | ICD-10-CM | POA: Diagnosis present

## 2020-07-19 DIAGNOSIS — Z20822 Contact with and (suspected) exposure to covid-19: Secondary | ICD-10-CM | POA: Diagnosis present

## 2020-07-19 DIAGNOSIS — K831 Obstruction of bile duct: Secondary | ICD-10-CM | POA: Diagnosis present

## 2020-07-19 DIAGNOSIS — K863 Pseudocyst of pancreas: Secondary | ICD-10-CM | POA: Diagnosis present

## 2020-07-19 DIAGNOSIS — K828 Other specified diseases of gallbladder: Secondary | ICD-10-CM | POA: Diagnosis present

## 2020-07-19 DIAGNOSIS — N1831 Chronic kidney disease, stage 3a: Secondary | ICD-10-CM | POA: Diagnosis present

## 2020-07-19 DIAGNOSIS — D72829 Elevated white blood cell count, unspecified: Secondary | ICD-10-CM

## 2020-07-19 DIAGNOSIS — I10 Essential (primary) hypertension: Secondary | ICD-10-CM

## 2020-07-19 DIAGNOSIS — Z9889 Other specified postprocedural states: Secondary | ICD-10-CM | POA: Diagnosis not present

## 2020-07-19 DIAGNOSIS — R945 Abnormal results of liver function studies: Secondary | ICD-10-CM | POA: Diagnosis not present

## 2020-07-19 DIAGNOSIS — Z87891 Personal history of nicotine dependence: Secondary | ICD-10-CM

## 2020-07-19 DIAGNOSIS — E785 Hyperlipidemia, unspecified: Secondary | ICD-10-CM | POA: Diagnosis present

## 2020-07-19 DIAGNOSIS — Z79899 Other long term (current) drug therapy: Secondary | ICD-10-CM

## 2020-07-19 DIAGNOSIS — E7800 Pure hypercholesterolemia, unspecified: Secondary | ICD-10-CM | POA: Diagnosis present

## 2020-07-19 DIAGNOSIS — I129 Hypertensive chronic kidney disease with stage 1 through stage 4 chronic kidney disease, or unspecified chronic kidney disease: Secondary | ICD-10-CM | POA: Diagnosis present

## 2020-07-19 DIAGNOSIS — K859 Acute pancreatitis without necrosis or infection, unspecified: Secondary | ICD-10-CM

## 2020-07-19 LAB — COMPREHENSIVE METABOLIC PANEL
ALT: 303 U/L — ABNORMAL HIGH (ref 0–44)
AST: 184 U/L — ABNORMAL HIGH (ref 15–41)
Albumin: 4.6 g/dL (ref 3.5–5.0)
Alkaline Phosphatase: 251 U/L — ABNORMAL HIGH (ref 38–126)
Anion gap: 14 (ref 5–15)
BUN: 22 mg/dL (ref 8–23)
CO2: 20 mmol/L — ABNORMAL LOW (ref 22–32)
Calcium: 9.8 mg/dL (ref 8.9–10.3)
Chloride: 103 mmol/L (ref 98–111)
Creatinine, Ser: 1.24 mg/dL (ref 0.61–1.24)
GFR, Estimated: >60 mL/min (ref >60)
Glucose, Bld: 95 mg/dL (ref 70–99)
Potassium: 4.5 mmol/L (ref 3.5–5.1)
Sodium: 137 mmol/L (ref 135–145)
Total Bilirubin: 2.5 mg/dL — ABNORMAL HIGH (ref 0.3–1.2)
Total Protein: 7.8 g/dL (ref 6.5–8.1)

## 2020-07-19 LAB — URINALYSIS, ROUTINE W REFLEX MICROSCOPIC
Bilirubin Urine: NEGATIVE
Glucose, UA: NEGATIVE mg/dL
Hgb urine dipstick: NEGATIVE
Ketones, ur: NEGATIVE mg/dL
Leukocytes,Ua: NEGATIVE
Nitrite: NEGATIVE
Protein, ur: 30 mg/dL — AB
Specific Gravity, Urine: 1.011 (ref 1.005–1.030)
pH: 7 (ref 5.0–8.0)

## 2020-07-19 LAB — TROPONIN I (HIGH SENSITIVITY)
Troponin I (High Sensitivity): 5 ng/L (ref <18)
Troponin I (High Sensitivity): 6 ng/L (ref <18)

## 2020-07-19 LAB — CBC WITH DIFFERENTIAL/PLATELET
Abs Immature Granulocytes: 0.06 10*3/uL (ref 0.00–0.07)
Basophils Absolute: 0.1 10*3/uL (ref 0.0–0.1)
Basophils Relative: 1 %
Eosinophils Absolute: 0.3 10*3/uL (ref 0.0–0.5)
Eosinophils Relative: 2 %
HCT: 46.6 % (ref 39.0–52.0)
Hemoglobin: 15.8 g/dL (ref 13.0–17.0)
Immature Granulocytes: 0 %
Lymphocytes Relative: 10 %
Lymphs Abs: 1.5 10*3/uL (ref 0.7–4.0)
MCH: 32.4 pg (ref 26.0–34.0)
MCHC: 33.9 g/dL (ref 30.0–36.0)
MCV: 95.5 fL (ref 80.0–100.0)
Monocytes Absolute: 0.7 10*3/uL (ref 0.1–1.0)
Monocytes Relative: 5 %
Neutro Abs: 12.2 10*3/uL — ABNORMAL HIGH (ref 1.7–7.7)
Neutrophils Relative %: 82 %
Platelets: 321 10*3/uL (ref 150–400)
RBC: 4.88 MIL/uL (ref 4.22–5.81)
RDW: 14 % (ref 11.5–15.5)
WBC: 14.9 10*3/uL — ABNORMAL HIGH (ref 4.0–10.5)
nRBC: 0 % (ref 0.0–0.2)

## 2020-07-19 LAB — RESP PANEL BY RT-PCR (FLU A&B, COVID) ARPGX2
Influenza A by PCR: NEGATIVE
Influenza B by PCR: NEGATIVE
SARS Coronavirus 2 by RT PCR: NEGATIVE

## 2020-07-19 LAB — LIPASE, BLOOD: Lipase: 1126 U/L — ABNORMAL HIGH (ref 11–51)

## 2020-07-19 MED ORDER — ATORVASTATIN CALCIUM 40 MG PO TABS
80.0000 mg | ORAL_TABLET | ORAL | Status: DC
  Administered 2020-07-20 – 2020-07-21 (×3): 80 mg via ORAL
  Filled 2020-07-19 (×3): qty 2

## 2020-07-19 MED ORDER — HYDROMORPHONE HCL 1 MG/ML IJ SOLN
1.0000 mg | INTRAMUSCULAR | Status: DC | PRN
  Administered 2020-07-19 (×2): 1 mg via INTRAVENOUS
  Filled 2020-07-19 (×2): qty 1

## 2020-07-19 MED ORDER — ACETAMINOPHEN 325 MG PO TABS: 650.0000 mg | ORAL_TABLET | Freq: Four times a day (QID) | ORAL | Status: DC | PRN

## 2020-07-19 MED ORDER — HYDROCHLOROTHIAZIDE 25 MG PO TABS
25.0000 mg | ORAL_TABLET | ORAL | Status: DC
  Administered 2020-07-20 (×2): 25 mg via ORAL
  Filled 2020-07-19 (×2): qty 1

## 2020-07-19 MED ORDER — HYDROMORPHONE HCL 1 MG/ML IJ SOLN
1.0000 mg | INTRAMUSCULAR | Status: DC | PRN
Start: 2020-07-19 — End: 2020-07-21
  Administered 2020-07-20 (×4): 1 mg via INTRAVENOUS
  Filled 2020-07-19 (×5): qty 1

## 2020-07-19 MED ORDER — PIPERACILLIN-TAZOBACTAM 3.375 G IVPB 30 MIN
3.3750 g | Freq: Once | INTRAVENOUS | Status: AC
  Administered 2020-07-19: 3.375 g via INTRAVENOUS
  Filled 2020-07-19: qty 50

## 2020-07-19 MED ORDER — HYDROMORPHONE HCL 1 MG/ML IJ SOLN
0.5000 mg | Freq: Once | INTRAMUSCULAR | Status: AC
  Administered 2020-07-19: 0.5 mg via INTRAVENOUS
  Filled 2020-07-19: qty 1

## 2020-07-19 MED ORDER — ONDANSETRON HCL 4 MG/2ML IJ SOLN: 4.0000 mg | Freq: Four times a day (QID) | INTRAMUSCULAR | Status: DC | PRN

## 2020-07-19 MED ORDER — ACETAMINOPHEN 650 MG RE SUPP: 650.0000 mg | Freq: Four times a day (QID) | RECTAL | Status: DC | PRN

## 2020-07-19 MED ORDER — PIPERACILLIN-TAZOBACTAM 3.375 G IVPB
3.3750 g | Freq: Three times a day (TID) | INTRAVENOUS | Status: DC
  Administered 2020-07-20 – 2020-07-21 (×5): 3.375 g via INTRAVENOUS
  Filled 2020-07-19 (×5): qty 50

## 2020-07-19 MED ORDER — ONDANSETRON HCL 4 MG PO TABS: 4.0000 mg | ORAL_TABLET | Freq: Four times a day (QID) | ORAL | Status: DC | PRN

## 2020-07-19 MED ORDER — LACTATED RINGERS IV SOLN: INTRAVENOUS | Status: DC

## 2020-07-19 MED ORDER — PIPERACILLIN-TAZOBACTAM 3.375 G IVPB 30 MIN
3.3750 g | Freq: Three times a day (TID) | INTRAVENOUS | Status: DC
  Filled 2020-07-19: qty 50

## 2020-07-19 MED ORDER — ONDANSETRON HCL 4 MG/2ML IJ SOLN
4.0000 mg | Freq: Once | INTRAMUSCULAR | Status: AC
  Administered 2020-07-19: 4 mg via INTRAVENOUS
  Filled 2020-07-19: qty 2

## 2020-07-19 MED ORDER — LISINOPRIL 20 MG PO TABS
20.0000 mg | ORAL_TABLET | ORAL | Status: DC
  Administered 2020-07-20 (×2): 20 mg via ORAL
  Filled 2020-07-19 (×2): qty 1

## 2020-07-19 MED ORDER — ASPIRIN 81 MG PO CHEW
81.0000 mg | CHEWABLE_TABLET | ORAL | Status: DC
  Administered 2020-07-20 – 2020-07-21 (×3): 81 mg via ORAL
  Filled 2020-07-19 (×3): qty 1

## 2020-07-19 MED ORDER — IOHEXOL 300 MG/ML  SOLN
100.0000 mL | Freq: Once | INTRAMUSCULAR | Status: AC | PRN
  Administered 2020-07-19: 100 mL via INTRAVENOUS

## 2020-07-19 MED ORDER — SODIUM CHLORIDE 0.9 % IV BOLUS
1000.0000 mL | Freq: Once | INTRAVENOUS | Status: AC
  Administered 2020-07-19: 1000 mL via INTRAVENOUS

## 2020-07-19 NOTE — H&P (Signed)
History and Physical    BRYSYN BRANDENBERGER YWV:[id number] DOB: 07-17-1950 DOA: 07/19/2020  PCP: Jolinda Croak, MD   Patient coming from: Home  Chief Complaint: RUQ abdominal pain, nausea  HPI: ETAI COPADO is a 70 y.o. male with medical history significant for HTN, chronic pancreatitis, HLD who presents for evaluation of RUQ abdominal pain.  He reports he went to lunch with his sister when he developed a sharp acute pain in the right upper quadrant of his abdomen.  It was associated with feeling hot and having nausea but he did not have any vomiting.  He reports he has had pancreatitis in the past and is felt similar but was in a different area being more on the right side of his upper abdomen.  Initially the pain was mild at a 4 out of 10 but quickly intensified to a severe pain of 10 out of 10 so he came to the hospital for evaluation.  Reports that the pain radiates up into his right shoulder but he has no chest pain.  Having any palpitations or shortness of breath.  He has not had any vomiting or diarrhea.  States he has not had any fever.  Denies any injury or trauma to his abdomen.  History of alcohol use but states he quit over 2 years ago.  Reports he tries to eat very healthy with only lean red meat once every 1 to 2 weeks otherwise he is eating white meat and lots of fruit and vegetables.  ED Course: He was remained hemodynamically stable in the emergency room.  And have an elevated white blood cell count of 14,900.  Lipase is elevated at 1126.  AST is 194, ALT 303, bilirubin 2.5.  Electrolytes are normal.  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  He also has dilatation of the common bile duct with a stenotic region near the end of the duct.  Gallbladder ultrasound shows a thickened gallbladder wall with sludge dilated common bile duct.  ER provider discussed with on-call gastroenterology, Dr. Almyra Free who will see patient in the morning and perform ERCP.  Hospitalist  service was asked to admit for further management  Review of Systems:  General: Denies  fever, chills, weight loss, night sweats.  Denies dizziness.  Denies change in appetite HENT: Denies head trauma, headache, denies change in hearing, tinnitus.  Denies nasal congestion or bleeding.  Denies sore throat, sores in mouth.  Denies difficulty swallowing Eyes: Denies blurry vision, pain in eye, drainage.  Denies discoloration of eyes. Neck: Denies pain.  Denies swelling.  Denies pain with movement. Cardiovascular: Denies chest pain, palpitations.  Denies edema.  Denies orthopnea Respiratory: Denies shortness of breath, cough.  Denies wheezing.  Denies sputum production Gastrointestinal: Reports RUQ abdominal pain. Reports nausea but no vomiting, diarrhea.  Denies melena.  Denies hematemesis. Musculoskeletal: Denies limitation of movement.  Denies deformity or swelling.  Denies pain.  Denies arthralgias or myalgias. Genitourinary: Denies pelvic pain.  Denies urinary frequency or hesitancy.  Denies dysuria.  Skin: Denies rash.  Denies petechiae, purpura, ecchymosis. Neurological: Denies headache.  Denies syncope.  Denies seizure activity.  Denies paresthesia.  Denies slurred speech, drooping face.  Denies visual change. Psychiatric: Denies depression, anxiety. Denies hallucinations.  Past Medical History:  Diagnosis Date  . Brain mass   . Chronic pancreatitis ([place])   . Headache   . Hypercholesteremia   . Hypertension   . Pancreas cyst   . Stage 3a chronic kidney disease (  HCC)   . Tobacco dependence   . Tubular adenoma of colon     Past Surgical History:  Procedure Laterality Date  . ESOPHAGOGASTRODUODENOSCOPY (EGD) WITH PROPOFOL N/A 12/30/2019   Procedure: ESOPHAGOGASTRODUODENOSCOPY (EGD) WITH PROPOFOL;  Surgeon: Carol Ada, MD;  Location: [facility];  Service: Endoscopy;  Laterality: N/A;  . FINE NEEDLE ASPIRATION N/A 12/30/2019   Procedure: FINE NEEDLE ASPIRATION (FNA) LINEAR;   Surgeon: Carol Ada, MD;  Location: [facility];  Service: Endoscopy;  Laterality: N/A;  . UPPER ESOPHAGEAL ENDOSCOPIC ULTRASOUND (EUS) N/A 12/30/2019   Procedure: UPPER ESOPHAGEAL ENDOSCOPIC ULTRASOUND (EUS);  Surgeon: Carol Ada, MD;  Location: [facility];  Service: Endoscopy;  Laterality: N/A;    Social History  reports that he has quit smoking. He has never used smokeless tobacco. He reports previous alcohol use. He reports that he does not use drugs.  No Known Allergies  Family History  Problem Relation Age of Onset  . Cancer Mother   . Cancer Father   . Cancer Sister   . Cancer Brother      Prior to Admission medications   Medication Sig Start Date End Date Taking? Authorizing Provider  ascorbic acid (VITAMIN C) 500 MG tablet Take 500 mg by mouth daily.    [provider]  aspirin 81 MG chewable tablet Chew 81 mg by mouth every morning.    [provider]  atorvastatin (LIPITOR) 80 MG tablet Take 80 mg by mouth every morning.    [provider]  Cholecalciferol (VITAMIN D3) 10 MCG (400 UNIT) CAPS Take 400 Units by mouth daily.     [provider]  gabapentin (NEURONTIN) 300 MG capsule Take 300 mg by mouth at bedtime.    [provider]  hydrochlorothiazide (HYDRODIURIL) 25 MG tablet Take 25 mg by mouth every morning.    [provider]  lisinopril (ZESTRIL) 20 MG tablet Take 20 mg by mouth every morning.    [provider]  oxyCODONE-acetaminophen (PERCOCET/ROXICET) 5-325 MG tablet Take 1 tablet by mouth every 6 (six) hours as needed for severe pain. 02/02/20   Alroy Bailiff, Marg[place], PA-C  rizatriptan (MAXALT-MLT) 10 MG disintegrating tablet Take 10 mg by mouth as needed for migraine. May repeat in 2 hours if needed    [provider]  VITAMIN D PO Take 1 tablet by mouth daily.    [provider]  zinc gluconate 50 MG tablet Take 50 mg by mouth every morning.    [provider]     Physical Exam: Vitals:   07/19/20 1845 07/19/20 1900 07/19/20 1915 07/19/20 2000  BP:  (!) 163/95  130/77  Pulse: 60 71 67 63  Resp:  18  15  Temp:      TempSrc:      SpO2: 98% 99% 100% 97%  Weight:      Height:        Constitutional: NAD, calm, comfortable Vitals:   07/19/20 1845 07/19/20 1900 07/19/20 1915 07/19/20 2000  BP:  (!) 163/95  130/77  Pulse: 60 71 67 63  Resp:  18  15  Temp:      TempSrc:      SpO2: 98% 99% 100% 97%  Weight:      Height:       General: WDWN, Alert and oriented x3.  Eyes: EOMI, PERRL, conjunctivae normal.  Sclera nonicteric HENT:  [place]/AT, external ears normal.  Nares patent without epistasis.  Mucous membranes are moist. Posterior pharynx clear of any exudate or  lesions.  Neck: Soft, normal range of motion, supple, no masses, no thyromegaly. Trachea midline Respiratory: clear to auscultation bilaterally, no wheezing, no crackles. Normal respiratory effort. No accessory muscle use.  Cardiovascular: Regular rate and rhythm, no murmurs / rubs / gallops. No extremity edema. 2+ pedal pulses.  Abdomen: Soft, RUQ tenderness, nondistended, no rebound or guarding. Positive Murphy's sign. No masses palpated. No hepatosplenomegaly. Bowel sounds normoactive Musculoskeletal: FROM. no cyanosis. No joint deformity upper and lower extremities. Normal muscle tone.  Skin: Warm, dry, intact no rashes, lesions, ulcers. No induration Neurologic: CN 2-12 grossly intact.  Normal speech.  Sensation intact, Strength 5/5 in all extremities.   Psychiatric: Normal judgment and insight.  Normal mood.    Labs on Admission: I have personally reviewed following labs and imaging studies  CBC: Recent Labs  Lab 07/19/20 1755  WBC 14.9*  NEUTROABS 12.2*  HGB 15.8  HCT 46.6  MCV 95.5  PLT 321    Basic Metabolic Panel: Recent Labs  Lab 07/19/20 1755  NA 137  K 4.5  CL 103  CO2 20*  GLUCOSE 95  BUN 22  CREATININE 1.24  CALCIUM 9.8    GFR: Estimated  Creatinine Clearance: 60.3 mL/min (by C-G formula based on SCr of 1.24 mg/dL).  Liver Function Tests: Recent Labs  Lab 07/19/20 1755  AST 184*  ALT 303*  ALKPHOS 251*  BILITOT 2.5*  PROT 7.8  ALBUMIN 4.6    Urine analysis:    Component Value Date/Time   COLORURINE YELLOW 07/19/2020 1921   APPEARANCEUR CLEAR 07/19/2020 1921   LABSPEC 1.011 07/19/2020 1921   PHURINE 7.0 07/19/2020 1921   GLUCOSEU NEGATIVE 07/19/2020 1921   HGBUR NEGATIVE 07/19/2020 1921   BILIRUBINUR NEGATIVE 07/19/2020 1921   KETONESUR NEGATIVE 07/19/2020 1921   PROTEINUR 30 (A) 07/19/2020 1921   NITRITE NEGATIVE 07/19/2020 1921   LEUKOCYTESUR NEGATIVE 07/19/2020 1921    Radiological Exams on Admission: CT ABDOMEN PELVIS W CONTRAST  Result Date: 07/19/2020 CLINICAL DATA:  Right upper quadrant pain and nausea. Chronic pancreatitis. EXAM: CT ABDOMEN AND PELVIS WITH CONTRAST TECHNIQUE: Multidetector CT imaging of the abdomen and pelvis was performed using the standard protocol following bolus administration of intravenous contrast. CONTRAST:  100mL OMNIPAQUE IOHEXOL 300 MG/ML  SOLN COMPARISON:  02/02/2020 FINDINGS: Lower Chest: No acute findings. Hepatobiliary: No hepatic masses identified. A few tiny sub-cm hepatic cysts remain stable. New mild diffuse gallbladder wall thickening and distension are noted. Mild diffuse intrahepatic biliary ductal dilatation is seen which is increased since previous study, with common bile duct now measuring 9 mm in diameter compared to 5 mm previously. Smooth stricture of the intrapancreatic portion of the distal common bile duct seen. Pancreas: Moderate diffuse pancreatic ductal dilatation shows increase since previous study. Increased calcifications are seen in the pancreatic head, consistent with chronic pancreatitis. There also several new rim enhancing cystic lesions within the pancreatic head, largest measuring 2.7 cm on image 26/2. No acute peripancreatic inflammatory changes are  seen. Spleen: Within normal limits in size and appearance. Adrenals/Urinary Tract: No masses identified. Bilateral renal cysts show no significant change. No evidence of ureteral calculi or hydronephrosis. Stomach/Bowel: No evidence of obstruction, inflammatory process or abnormal fluid collections. Normal appendix visualized. Vascular/Lymphatic: No pathologically enlarged lymph nodes. No acute vascular findings. Aortic atherosclerotic calcification noted. Reproductive:  Stable mildly enlarged prostate. Other:  None. Musculoskeletal:  No suspicious bone lesions identified. IMPRESSION: Increased diffuse biliary ductal dilatation, with smooth stricture of distal common bile duct, most likely due to  chronic pancreatitis involving the pancreatic head. No pancreatic mass identified. Several new rim enhancing cystic lesions within the pancreatic head, largest measuring 2.7 cm, consistent with pancreatic pseudocysts. Mild diffuse gallbladder wall thickening and distension, which are nonspecific. These findings could be due to chronic pancreatitis, however cholecystitis cannot be excluded. Aortic Atherosclerosis (ICD10-I70.0). Electronically Signed   By: Marlaine Hind M.D.   On: 07/19/2020 21:07   US ABDOMEN LIMITED RUQ (LIVER/GB)  Result Date: 07/19/2020 CLINICAL DATA:  Right upper quadrant pain EXAM: ULTRASOUND ABDOMEN LIMITED RIGHT UPPER QUADRANT COMPARISON:  06/18/2017 FINDINGS: Gallbladder: Gallbladder sludge. Mild wall thickening measuring 3.4 mm. Positive sonographic Murphy's sign. Common bile duct: Diameter: Dilated measuring 13.8 mm. On CT from 02/02/2020 the common bile duct was measured at 5 mm. Liver: No focal lesion identified. Within normal limits in parenchymal echogenicity. Portal vein is patent on color Doppler imaging with normal direction of blood flow towards the liver. Other: 2 cm cyst identified within the right kidney. IMPRESSION: 1. Gallbladder wall thickening, gallbladder sludge and sonographic  Murphy's sign. Findings may reflect acalculous cholecystitis. 2. Dilated common bile duct measuring 13.8 mm in diameter. Electronically Signed   By: Kerby Moors M.D.   On: 07/19/2020 18:19    EKG: Independently reviewed.  EKG shows normal sinus rhythm with no acute ST elevation or depression.  QTc 386  Assessment/Plan Principal Problem:   Acute acalculous cholecystitis Mr. Kincaid is admitted to [facility] floor.  He has a calculus cholecystitis on ultrasound.  He has sludge in the common bile duct with a stricture.  Gastroenterology was consulted by the ER physician and will see patient in the morning.  Gastroenterology is planning for ERCP tomorrow morning so patient be kept n.p.o. after midnight IV fluid hydration. Patient placed on Zosyn empirically.  Active Problems:   Pancreatitis Pain control with Dilaudid overnight.  Lipase is 1126.  Patient has history of pancreatitis in the past with no recent flareup.  He quit drinking alcohol 2 years ago Clear liquids tonight and then n.p.o. after midnight    Essential hypertension Continue home medications of lisinopril and hydrochlorothiazide.  Monitor blood pressure      Leukocytosis WBC is elevated at 14,900.  Patient placed on Zosyn as above  DVT prophylaxis: Padua score low.  Early ambulation and TED hose for DVT prophylaxis Code Status:   Full code Family Communication:  Diagnosis and plan discussed with patient.  Patient verbalized understanding agrees with plan.  Further recommendation follow as clinical indicated Disposition Plan:   Patient is from:  Home  Anticipated DC to:  Home  Anticipated DC date:  Anticipate 2 midnight or more stay in the hospital  Anticipated DC barriers: No barriers to discharge identified at this time  Consults called:       gastroenterology, Dr. Stana Bunting will see patient in the morning Admission status:  Inpatient   Yevonne Aline Illias Pantano MD [facility]  How to contact the [facility] Attending or  Consulting provider [place] or covering provider during after hours [place], for this patient?   1. Check the care team in [facility] and look for a) attending/consulting [facility] provider listed and b) the [facility] team listed 2. Log into [email or web address] and use [place]'s universal password to access. If you do not have the password, please contact the hospital operator. 3. Locate the [facility] provider you are looking for under [facility] and page to a number that you can be directly reached. 4. If you still have difficulty reaching  the provider, please page the [facility] (Director on Call) for the Hospitalists listed on amion for assistance.  07/19/2020, 9:16 PM

## 2020-07-19 NOTE — ED Triage Notes (Signed)
Patient c/o RUQ abdominal pain and nausea. Patient states he was recently diagnosed with pancreatitis.

## 2020-07-19 NOTE — ED Provider Notes (Signed)
I personally evaluated the patient during the encounter and completed a history, physical, procedures, medical decision making to contribute to the overall care of the patient and decision making for the patient briefly, the patient is a 70 y.o. male here with abdominal pain.  Right upper quadrant.  Started this morning.  History of pancreatitis.  Patient with lipase of 1126.  AST 184, ALT 303, alk phos 251, bilirubin 2.5.  Patient with gallbladder wall thickening and gallbladder sludge and dilated common bile duct.  Overall could be a calculus cholecystitis.  Could be choledocholithiasis.  Likely continues to have pancreatitis as well.  We will start Zosyn as concern for possible infectious process.  Does not have a fever.  We will get CT scan to further evaluate.  Could be neoplastic process but could be all in the setting of acute on chronic pancreatitis as well.  This chart was dictated using voice recognition software.  Despite best efforts to proofread,  errors can occur which can change the documentation meaning.     EKG Interpretation  Date/Time:  Thursday Jul 19 2020 16:46:12 EDT Ventricular Rate:  60 PR Interval:  146 QRS Duration: 74 QT Interval:  386 QTC Calculation: 386 R Axis:   72 Text Interpretation: Normal sinus rhythm Septal infarct , age undetermined Abnormal ECG Confirmed by Lennice Sites [phone]) on 07/19/2020 4:54:04 PM           Lennice Sites, DO 07/19/20 1949

## 2020-07-19 NOTE — ED Provider Notes (Signed)
Emergency Medicine Provider Triage Evaluation Note  Patrick Mack , a 70 y.o. male  was evaluated in triage.  Pt complains of RUQ abdominal pain around 13:30. Patient denies eating any lunch today.   Patient recently diagnosed with pancreatitis 10/21.  Had onset of of diaphoresis and dizziness associated with his pain. Denies etoh.  Has been eating a healthy diet does endorse occasional spicy food.  Had shrimp with [place] last night.    Review of Systems  Positive: Nausea, RUQ abd pain, blaoting, diaphoresis, belching  Negative: Chest pain, shob, fevers, chills vomiting,   Physical Exam  BP (!) 144/79 (BP Location: Left Arm)   Pulse 89   Temp 98.8 F (37.1 C) (Oral)   Resp 17   Ht 5\' 11"  (1.803 m)   Wt 74.8 kg   SpO2 100%   BMI 23.01 kg/m  Gen:   Awake, no distress   Resp:  Normal effort  MSK:   Moves extremities without difficulty  Other:  abd soft and nondistended,  Tenderness to RUQ   Medical Decision Making  Medically screening exam initiated at 3:43 PM.  Appropriate orders placed.  Claudina Lick was informed that the remainder of the evaluation will be completed by another provider, this initial triage assessment does not replace that evaluation, and the importance of remaining in the ED until their evaluation is complete.  The patient appears stable so that the remainder of the work up may be completed by another provider.      Loni Beckwith, PA-C 07/19/20 1548    Drenda Freeze, MD 07/21/20 1536

## 2020-07-19 NOTE — ED Notes (Signed)
Pain medicine given. Patient on cardia monitor. PA and MD at bedside speaking with patient.

## 2020-07-19 NOTE — Progress Notes (Signed)
A consult was received from an ED physician for Zosyn per pharmacy dosing.  The patient's profile has been reviewed for ht/wt/allergies/indication/available labs.   A one time order has been placed for Zosyn 3.375g IV.  Further antibiotics/pharmacy consults should be ordered by admitting physician if indicated.                       Thank you, Efraim Kaufmann, PharmD, BCPS 07/19/2020  7:54 PM

## 2020-07-19 NOTE — ED Provider Notes (Signed)
[facility] DEPT Provider Note   CSN: [id number] Arrival date & time: 07/19/20  1450     History Chief Complaint  Patient presents with  . Abdominal Pain    Patrick Mack is a 70 y.o. male with a past medical history of chronic pancreatitis, hypercholesterolemia, hypertension.  He does not drink any alcohol and does not smoke for many years.  Patient states that he was getting ready to eat lunch with his sister when he had sudden onset of severe pain in his right upper quadrant.  He states that he clutched his stomach and felt like he had to belch.  He was able to belch and then suddenly became very sweaty and extremely nauseous.  He states that the pain is waxing and waning, severe, he has current 10 out of 10 pain that radiates to his right shoulder.  He denies chest pain or shortness of breath.  He denies any known history of cardiac disease.  He states that this feels similar to previous episodes of pancreatitis but is in the wrong location.  He denies urinary symptoms, fevers, cough.  HPI     Past Medical History:  Diagnosis Date  . Brain mass   . Chronic pancreatitis ([place])   . Headache   . Hypercholesteremia   . Hypertension   . Pancreas cyst   . Stage 3a chronic kidney disease ([place])   . Tobacco dependence   . Tubular adenoma of colon     There are no problems to display for this patient.   Past Surgical History:  Procedure Laterality Date  . ESOPHAGOGASTRODUODENOSCOPY (EGD) WITH PROPOFOL N/A 12/30/2019   Procedure: ESOPHAGOGASTRODUODENOSCOPY (EGD) WITH PROPOFOL;  Surgeon: Carol Ada, MD;  Location: [facility] ENDOSCOPY;  Service: Endoscopy;  Laterality: N/A;  . FINE NEEDLE ASPIRATION N/A 12/30/2019   Procedure: FINE NEEDLE ASPIRATION (FNA) LINEAR;  Surgeon: Carol Ada, MD;  Location: [facility] ENDOSCOPY;  Service: Endoscopy;  Laterality: N/A;  . UPPER ESOPHAGEAL ENDOSCOPIC ULTRASOUND (EUS) N/A 12/30/2019   Procedure: UPPER ESOPHAGEAL  ENDOSCOPIC ULTRASOUND (EUS);  Surgeon: Carol Ada, MD;  Location: [facility];  Service: Endoscopy;  Laterality: N/A;       Family History  Problem Relation Age of Onset  . Cancer Mother   . Cancer Father   . Cancer Sister   . Cancer Brother     Social History   Tobacco Use  . Smoking status: Former Research scientist (life sciences)  . Smokeless tobacco: Never Used  Vaping Use  . Vaping Use: Never used  Substance Use Topics  . Alcohol use: Not Currently  . Drug use: Never    Home Medications Prior to Admission medications   Medication Sig Start Date End Date Taking? Authorizing Provider  ascorbic acid (VITAMIN C) 500 MG tablet Take 500 mg by mouth daily.    [provider]  aspirin 81 MG chewable tablet Chew 81 mg by mouth every morning.    [provider]  atorvastatin (LIPITOR) 80 MG tablet Take 80 mg by mouth every morning.    [provider]  Cholecalciferol (VITAMIN D3) 10 MCG (400 UNIT) CAPS Take 400 Units by mouth daily.     [provider]  gabapentin (NEURONTIN) 300 MG capsule Take 300 mg by mouth at bedtime.    [provider]  hydrochlorothiazide (HYDRODIURIL) 25 MG tablet Take 25 mg by mouth every morning.    [provider]  lisinopril (ZESTRIL) 20 MG tablet Take 20 mg by mouth every morning.  [provider]  oxyCODONE-acetaminophen (PERCOCET/ROXICET) 5-325 MG tablet Take 1 tablet by mouth every 6 (six) hours as needed for severe pain. 02/02/20   Alroy Bailiff, Margaux, PA-C  rizatriptan (MAXALT-MLT) 10 MG disintegrating tablet Take 10 mg by mouth as needed for migraine. May repeat in 2 hours if needed    [provider]  VITAMIN D PO Take 1 tablet by mouth daily.    [provider]  zinc gluconate 50 MG tablet Take 50 mg by mouth every morning.    [provider]    Allergies    Patient has no known allergies.  Review of Systems   Review of Systems Ten systems reviewed and are negative for acute  change, except as noted in the HPI.   Physical Exam Updated Vital Signs BP 130/77   Pulse 63   Temp 98.8 F (37.1 C) (Oral)   Resp 15   Ht _0  (1.803 m)   Wt 74.8 kg   SpO2 97%   BMI 23.01 kg/m   Physical Exam Vitals and nursing note reviewed.  Constitutional:      General: He is not in acute distress.    Appearance: He is well-developed. He is not diaphoretic.  HENT:     Head: Normocephalic and atraumatic.  Eyes:     General: No scleral icterus.    Conjunctiva/sclera: Conjunctivae normal.  Cardiovascular:     Rate and Rhythm: Normal rate and regular rhythm.     Heart sounds: Normal heart sounds.  Pulmonary:     Effort: Pulmonary effort is normal. No respiratory distress.     Breath sounds: Normal breath sounds.  Abdominal:     Palpations: Abdomen is soft.     Tenderness: There is abdominal tenderness in the right upper quadrant and epigastric area.  Musculoskeletal:     Cervical back: Normal range of motion and neck supple.  Skin:    General: Skin is warm and dry.  Neurological:     Mental Status: He is alert.  Psychiatric:        Behavior: Behavior normal.     ED Results / Procedures / Treatments   Labs (all labs ordered are listed, but only abnormal results are displayed) Labs Reviewed  LIPASE, BLOOD - Abnormal; Notable for the following components:      Result Value   Lipase 1,126 (*)    All other components within normal limits  COMPREHENSIVE METABOLIC PANEL - Abnormal; Notable for the following components:   CO2 20 (*)    AST 184 (*)    ALT 303 (*)    Alkaline Phosphatase 251 (*)    Total Bilirubin 2.5 (*)    All other components within normal limits  URINALYSIS, ROUTINE W REFLEX MICROSCOPIC - Abnormal; Notable for the following components:   Protein, ur 30 (*)    Bacteria, UA RARE (*)    All other components within normal limits  CBC WITH DIFFERENTIAL/PLATELET - Abnormal; Notable for the following components:   WBC 14.9 (*)    Neutro Abs  12.2 (*)    All other components within normal limits  RESP PANEL BY RT-PCR (FLU A&B, COVID) [id number]  TROPONIN I (HIGH SENSITIVITY)  TROPONIN I (HIGH SENSITIVITY)    EKG EKG Interpretation  Date/Time:  Thursday Jul 19 2020 16:46:12 EDT Ventricular Rate:  60 PR Interval:  146 QRS Duration: 74 QT Interval:  386 QTC Calculation: 386 R Axis:   72 Text Interpretation: Normal sinus rhythm Septal infarct , age  undetermined Abnormal ECG Confirmed by Lennice Sites [phone]) on 07/19/2020 4:54:04 PM   Radiology US ABDOMEN LIMITED RUQ (LIVER/GB)  Result Date: 07/19/2020 CLINICAL DATA:  Right upper quadrant pain EXAM: ULTRASOUND ABDOMEN LIMITED RIGHT UPPER QUADRANT COMPARISON:  06/18/2017 FINDINGS: Gallbladder: Gallbladder sludge. Mild wall thickening measuring 3.4 mm. Positive sonographic Murphy's sign. Common bile duct: Diameter: Dilated measuring 13.8 mm. On CT from 02/02/2020 the common bile duct was measured at 5 mm. Liver: No focal lesion identified. Within normal limits in parenchymal echogenicity. Portal vein is patent on color Doppler imaging with normal direction of blood flow towards the liver. Other: 2 cm cyst identified within the right kidney. IMPRESSION: 1. Gallbladder wall thickening, gallbladder sludge and sonographic Murphy's sign. Findings may reflect acalculous cholecystitis. 2. Dilated common bile duct measuring 13.8 mm in diameter. Electronically Signed   By: Kerby Moors M.D.   On: 07/19/2020 18:19    Procedures Procedures   Medications Ordered in ED Medications  HYDROmorphone (DILAUDID) injection 1 mg (1 mg Intravenous Given 07/19/20 1935)  sodium chloride 0.9 % bolus 1,000 mL (0 mLs Intravenous Stopped 07/19/20 1927)  ondansetron (ZOFRAN) injection 4 mg (4 mg Intravenous Given 07/19/20 1754)  HYDROmorphone (DILAUDID) injection 0.5 mg (0.5 mg Intravenous Given 07/19/20 1755)  iohexol (OMNIPAQUE) 300 MG/ML solution 100 mL (100 mLs Intravenous Contrast Given 07/19/20 2021)   piperacillin-tazobactam (ZOSYN) IVPB 3.375 g (3.375 g Intravenous New Bag/Given 07/19/20 2009)    ED Course  I have reviewed the triage vital signs and the nursing notes.  Pertinent labs & imaging results that were available during my care of the patient were reviewed by me and considered in my medical decision making (see chart for details).    MDM Rules/Calculators/A&P                          CC:RUQ pain VS:  Vitals:   07/19/20 1845 07/19/20 1900 07/19/20 1915 07/19/20 2000  BP:  (!) 163/95  130/77  Pulse: 60 71 67 63  Resp:  18  15  Temp:      TempSrc:      SpO2: 98% 99% 100% 97%  Weight:      Height:        KG:URKYHCW is gathered by patient and emr. Previous records obtained and reviewed. DDX:The patient's complaint of RUQ pain involves an extensive number of diagnostic and treatment options, and is a complaint that carries with it a high risk of complications, morbidity, and potential mortality. Given the large differential diagnosis, medical decision making is of high complexity. The differential diagnosis for RUQ is, but not limited to:  Cholelithiasis, cholecystitis, cholangitis, choledocholithiasis, hepatitis, pancreatitis, RLL pneumonia, pyelonephritis, urinary calculi, abdominal/liver abscess, musculoskeletal pain, herpes zoster.  Labs: I ordered reviewed and interpreted labs which include Cbc- Leukocytosis CMP- Elevated ast, alt, alk phos, bilirubin Lipase- markedly elevated Troponins wnl resp panel negative Imaging: I ordered and reviewed images which included RUQ [place]. I independently visualized and interpreted all imaging. Significant findings include sludge, + Murphy sign, dilated CBD.  CT image pending EKG: Normal sinus rhythm at a rate of 60 without ischemic abnormality Consults: Case discussed with Dr. Benson Norway of gastroenterology who plans for ERCP in the morning.  Dr. Tonie Griffith MDM: Patient here with sudden onset of severe right upper quadrant abdominal pain.   This is likely representative of choledocholithiasis with gallstone pancreatitis.  Patient receiving Zosyn, pain unfortunately poorly controlled despite multiple rounds of narcotic pain medications.  Patient  will be n.p.o. after midnight for ERCP in the morning.  He is otherwise stable throughout his visit here. Patient disposition:The patient appears reasonably stabilized for admission considering the current resources, flow, and capabilities available in the ED at this time, and I doubt any other [facility] requiring further screening and/or treatment in the ED prior to admission.        Final Clinical Impression(s) / ED Diagnoses Final diagnoses:  Bilious emesis  Calculus of bile duct with acute cholecystitis and obstruction  Acute pancreatitis without infection or necrosis, unspecified pancreatitis type    Rx / DC Orders ED Discharge Orders    None       Margarita Mail, PA-C 07/19/20 2102    Lennice Sites, DO 07/19/20 2328

## 2020-07-19 NOTE — ED Notes (Signed)
Pain medicine given. Patient states pain has increased.

## 2020-07-19 NOTE — ED Notes (Signed)
Patient to CT.

## 2020-07-20 ENCOUNTER — Encounter (HOSPITAL_COMMUNITY)
Admission: EM | Disposition: A | Discharge status: Home / Self Care | Payer: Self-pay | Source: Home / Self Care | Attending: Internal Medicine

## 2020-07-20 ENCOUNTER — Inpatient Hospital Stay (HOSPITAL_COMMUNITY): Payer: Medicare HMO

## 2020-07-20 ENCOUNTER — Encounter (HOSPITAL_COMMUNITY): Payer: Self-pay | Admitting: Family Medicine

## 2020-07-20 ENCOUNTER — Inpatient Hospital Stay (HOSPITAL_COMMUNITY): Payer: Medicare HMO | Admitting: Anesthesiology

## 2020-07-20 DIAGNOSIS — K810 Acute cholecystitis: Secondary | ICD-10-CM | POA: Diagnosis not present

## 2020-07-20 HISTORY — PX: ERCP: SHX5425

## 2020-07-20 HISTORY — PX: BILIARY STENT PLACEMENT: SHX5538

## 2020-07-20 HISTORY — PX: SPHINCTEROTOMY: SHX5279

## 2020-07-20 LAB — COMPREHENSIVE METABOLIC PANEL
ALT: 213 U/L — ABNORMAL HIGH (ref 0–44)
AST: 94 U/L — ABNORMAL HIGH (ref 15–41)
Albumin: 4.2 g/dL (ref 3.5–5.0)
Alkaline Phosphatase: 208 U/L — ABNORMAL HIGH (ref 38–126)
Anion gap: 11 (ref 5–15)
BUN: 23 mg/dL (ref 8–23)
CO2: 23 mmol/L (ref 22–32)
Calcium: 9.3 mg/dL (ref 8.9–10.3)
Chloride: 105 mmol/L (ref 98–111)
Creatinine, Ser: 1.48 mg/dL — ABNORMAL HIGH (ref 0.61–1.24)
GFR, Estimated: 51 mL/min — ABNORMAL LOW (ref >60)
Glucose, Bld: 97 mg/dL (ref 70–99)
Potassium: 4.3 mmol/L (ref 3.5–5.1)
Sodium: 139 mmol/L (ref 135–145)
Total Bilirubin: 1 mg/dL (ref 0.3–1.2)
Total Protein: 6.7 g/dL (ref 6.5–8.1)

## 2020-07-20 LAB — CBC
HCT: 37.6 % — ABNORMAL LOW (ref 39.0–52.0)
Hemoglobin: 12.6 g/dL — ABNORMAL LOW (ref 13.0–17.0)
MCH: 32.1 pg (ref 26.0–34.0)
MCHC: 33.5 g/dL (ref 30.0–36.0)
MCV: 95.9 fL (ref 80.0–100.0)
Platelets: 255 10*3/uL (ref 150–400)
RBC: 3.92 MIL/uL — ABNORMAL LOW (ref 4.22–5.81)
RDW: 14 % (ref 11.5–15.5)
WBC: 13.1 10*3/uL — ABNORMAL HIGH (ref 4.0–10.5)
nRBC: 0 % (ref 0.0–0.2)

## 2020-07-20 LAB — HIV ANTIBODY (ROUTINE TESTING W REFLEX): HIV Screen 4th Generation wRfx: NONREACTIVE

## 2020-07-20 SURGERY — ERCP, WITH INTERVENTION IF INDICATED
Anesthesia: General

## 2020-07-20 MED ORDER — PROPOFOL 10 MG/ML IV BOLUS
INTRAVENOUS | Status: DC | PRN
  Administered 2020-07-20: 130 mg via INTRAVENOUS

## 2020-07-20 MED ORDER — GLUCAGON HCL RDNA (DIAGNOSTIC) 1 MG IJ SOLR
INTRAMUSCULAR | Status: AC
  Filled 2020-07-20: qty 1

## 2020-07-20 MED ORDER — ROCURONIUM BROMIDE 10 MG/ML (PF) SYRINGE
PREFILLED_SYRINGE | INTRAVENOUS | Status: DC | PRN
  Administered 2020-07-20: 70 mg via INTRAVENOUS

## 2020-07-20 MED ORDER — MIDAZOLAM HCL 2 MG/2ML IJ SOLN
INTRAMUSCULAR | Status: AC
  Filled 2020-07-20: qty 2

## 2020-07-20 MED ORDER — PHENYLEPHRINE HCL-NACL 10-0.9 MG/250ML-% IV SOLN
INTRAVENOUS | Status: DC | PRN
  Administered 2020-07-20: 50 ug/min via INTRAVENOUS

## 2020-07-20 MED ORDER — SODIUM CHLORIDE 0.9 % IV SOLN
INTRAVENOUS | Status: DC | PRN
  Administered 2020-07-20: 70 mL

## 2020-07-20 MED ORDER — INDOMETHACIN 50 MG RE SUPP
RECTAL | Status: AC
  Filled 2020-07-20: qty 1

## 2020-07-20 MED ORDER — LIDOCAINE 2% (20 MG/ML) 5 ML SYRINGE
INTRAMUSCULAR | Status: DC | PRN
  Administered 2020-07-20: 100 mg via INTRAVENOUS

## 2020-07-20 MED ORDER — INDOMETHACIN 50 MG RE SUPP
RECTAL | Status: DC | PRN
  Administered 2020-07-20: 100 mg via RECTAL

## 2020-07-20 MED ORDER — FENTANYL CITRATE (PF) 100 MCG/2ML IJ SOLN
INTRAMUSCULAR | Status: AC
  Filled 2020-07-20: qty 2

## 2020-07-20 MED ORDER — DEXAMETHASONE SODIUM PHOSPHATE 10 MG/ML IJ SOLN
INTRAMUSCULAR | Status: DC | PRN
  Administered 2020-07-20: 10 mg via INTRAVENOUS

## 2020-07-20 MED ORDER — PHENYLEPHRINE 40 MCG/ML (10ML) SYRINGE FOR IV PUSH (FOR BLOOD PRESSURE SUPPORT)
PREFILLED_SYRINGE | INTRAVENOUS | Status: DC | PRN
  Administered 2020-07-20 (×3): 120 ug via INTRAVENOUS

## 2020-07-20 MED ORDER — EPHEDRINE SULFATE-NACL 50-0.9 MG/10ML-% IV SOSY
PREFILLED_SYRINGE | INTRAVENOUS | Status: DC | PRN
  Administered 2020-07-20 (×2): 5 mg via INTRAVENOUS

## 2020-07-20 MED ORDER — GLUCAGON HCL RDNA (DIAGNOSTIC) 1 MG IJ SOLR
INTRAMUSCULAR | Status: DC | PRN
  Administered 2020-07-20: .5 mg via INTRAVENOUS

## 2020-07-20 MED ORDER — MIDAZOLAM HCL 2 MG/2ML IJ SOLN
INTRAMUSCULAR | Status: DC | PRN
  Administered 2020-07-20: 2 mg via INTRAVENOUS

## 2020-07-20 MED ORDER — FENTANYL CITRATE (PF) 100 MCG/2ML IJ SOLN
INTRAMUSCULAR | Status: DC | PRN
  Administered 2020-07-20 (×2): 50 ug via INTRAVENOUS

## 2020-07-20 MED ORDER — SODIUM CHLORIDE 0.9 % IV SOLN: INTRAVENOUS | Status: DC

## 2020-07-20 MED ORDER — ONDANSETRON HCL 4 MG/2ML IJ SOLN
INTRAMUSCULAR | Status: DC | PRN
  Administered 2020-07-20: 4 mg via INTRAVENOUS

## 2020-07-20 NOTE — Progress Notes (Signed)
PROGRESS NOTE    Patrick Mack  [id number] DOB: Jan 09, 1951 DOA: 07/19/2020 PCP: Jolinda Croak, MD   Brief Narrative:  Patrick Mack is a 70 y.o. male with medical history significant for HTN, chronic pancreatitis, HLD who presents for evaluation of RUQ abdominal pain.   Patient and sister at bedside indicate acute sharp right upper quadrant pain with associated nausea but no vomiting.  Similar to previous episodes of pancreatitis.  Patient reported to the ED nearly immediately given no history of pancreatitis for further treatment and evaluation.  In the ED patient had leukocytosis, lipase over thousand with minimally elevated AST ALT and bilirubin, CT abdomen notable for pseudocyst in the pancreatic head and dilated common bile duct.  Right upper quadrant ultrasound indicative of thickened gallbladder wall with sludge and dilated common bile duct, GI called for consult, hospitalist called for admission.  Assessment & Plan:   Principal Problem:   Acute acalculous cholecystitis Active Problems:   Pancreatitis   Essential hypertension   Leukocytosis   Acute intractable abdominal pain, nausea, POA Common bile duct stricture -Notable stricture on CT and ultrasound likely secondary to chronic recurrent pancreatitis -GI following status post ERCP with successful stent placement 07/20/2020 -Symptoms drastically resolved since procedure, will continue to follow clinically, may benefit from surgical evaluation in the near future pending clinical course -likely to be an outpatient follow-up at this point - Continue IVF/Zosyn for prophylactic coverage peri-procedure-unlikely infectious process; low threshold to discontinue antibiotics in the next 24 hours  Acute on chronic recurrent pancreatitis in the setting of above - GI following, status post ERCP and stent placement 07/20/2020 - Lipase 1126 -continue to follow clinically -Continue IV fluids aggressively - Resume full liquid diet  per GI postoperatively, advance diet as tolerated  Essential hypertension - Continue lisinopril and hydrochlorothiazide    Leukemoid reaction, rule out infection, POA - WBC is elevated at 15 at intake, downtrending appropriately, follow morning labs  DVT prophylaxis:      Padua score low.  Early ambulation and TED hose for DVT prophylaxis Code Status:              Full code Family Communication: Sister at bedside  Status is: Inpatient  Dispo: The patient is from: Home              Anticipated d/c is to: Home              Anticipated d/c date is: 24 to 48 hours              Patient currently not medically stable for discharge  Consultants:   GI  Procedures:   ERCP 07/20/2020 successful stent placement  Antimicrobials:  Zosyn, 07/19/2020 -ongoing  Subjective: No acute issues or events overnight, patient still somewhat altered postprocedure, alert to person and general situation but review of systems markedly limited.  Objective: Vitals:   07/19/20 2301 07/19/20 2313 07/20/20 0139 07/20/20 0504  BP: (!) 172/77 (!) 172/77 (!) 141/66 132/81  Pulse: 64 64 (!) 54 66  Resp: 20 20 18 16   Temp: (!) 97.5 F (36.4 C) (!) 97.5 F (36.4 C) 98.8 F (37.1 C) 98.4 F (36.9 C)  TempSrc: Oral Oral Oral Oral  SpO2: 100% 100% 100% 98%  Weight:  74.8 kg    Height:  5\' 11"  (1.803 m)      Intake/Output Summary (Last 24 hours) at 07/20/2020 0713 Last data filed at 07/20/2020 0300 Gross per 24 hour  Intake 1374.43 ml  Output 600 ml  Net 774.43 ml   Filed Weights   07/19/20 1532 07/19/20 2313  Weight: 74.8 kg 74.8 kg    Examination:  General:  Pleasantly resting in bed, No acute distress.  Alert to person and situation only HEENT:  Normocephalic atraumatic.  Sclerae nonicteric, noninjected.  Extraocular movements intact bilaterally. Neck:  Without mass or deformity.  Trachea is midline. Lungs:  Clear to auscultate bilaterally without rhonchi, wheeze, or rales. Heart:  Regular rate  and rhythm.  Without murmurs, rubs, or gallops. Abdomen:  Soft, nontender, nondistended.  Without guarding or rebound. Extremities: Without cyanosis, clubbing, edema, or obvious deformity. Vascular:  Dorsalis pedis and posterior tibial pulses palpable bilaterally. Skin:  Warm and dry, no erythema, no ulcerations.  Data Reviewed: I have personally reviewed following labs and imaging studies  CBC: Recent Labs  Lab 07/19/20 1755 07/20/20 0338  WBC 14.9* 13.1*  NEUTROABS 12.2*  --   HGB 15.8 12.6*  HCT 46.6 37.6*  MCV 95.5 95.9  PLT 321 829   Basic Metabolic Panel: Recent Labs  Lab 07/19/20 1755 07/20/20 0338  NA 137 139  K 4.5 4.3  CL 103 105  CO2 20* 23  GLUCOSE 95 97  BUN 22 23  CREATININE 1.24 1.48*  CALCIUM 9.8 9.3   GFR: Estimated Creatinine Clearance: 50.5 mL/min (A) (by C-G formula based on SCr of 1.48 mg/dL (H)). Liver Function Tests: Recent Labs  Lab 07/19/20 1755 07/20/20 0338  AST 184* 94*  ALT 303* 213*  ALKPHOS 251* 208*  BILITOT 2.5* 1.0  PROT 7.8 6.7  ALBUMIN 4.6 4.2   Recent Labs  Lab 07/19/20 1755  LIPASE 1,126*   No results for input(s): AMMONIA in the last 168 hours. Coagulation Profile: No results for input(s): INR, PROTIME in the last 168 hours. Cardiac Enzymes: No results for input(s): CKTOTAL, CKMB, CKMBINDEX, TROPONINI in the last 168 hours. BNP (last 3 results) No results for input(s): PROBNP in the last 8760 hours. HbA1C: No results for input(s): HGBA1C in the last 72 hours. CBG: No results for input(s): GLUCAP in the last 168 hours. Lipid Profile: No results for input(s): CHOL, HDL, LDLCALC, TRIG, CHOLHDL, LDLDIRECT in the last 72 hours. Thyroid Function Tests: No results for input(s): TSH, T4TOTAL, FREET4, T3FREE, THYROIDAB in the last 72 hours. Anemia Panel: No results for input(s): VITAMINB12, FOLATE, FERRITIN, TIBC, IRON, RETICCTPCT in the last 72 hours. Sepsis Labs: No results for input(s): PROCALCITON, LATICACIDVEN  in the last 168 hours.  Recent Results (from the past 240 hour(s))  Resp Panel by RT-PCR (Flu A&B, Covid) Nasopharyngeal Swab     Status: None   Collection Time: 07/19/20  7:27 PM   Specimen: Nasopharyngeal Swab; Nasopharyngeal(NP) swabs in vial transport medium  Result Value Ref Range Status   SARS Coronavirus 2 by RT PCR NEGATIVE NEGATIVE Final    Comment: (NOTE) SARS-CoV-2 target nucleic acids are NOT DETECTED.  The SARS-CoV-2 RNA is generally detectable in upper respiratory specimens during the acute phase of infection. The lowest concentration of SARS-CoV-2 viral copies this assay can detect is 138 copies/mL. A negative result does not preclude SARS-Cov-2 infection and should not be used as the sole basis for treatment or other patient management decisions. A negative result may occur with  improper specimen collection/handling, submission of specimen other than nasopharyngeal swab, presence of viral mutation(s) within the areas targeted by this assay, and inadequate number of viral copies(<138 copies/mL). A negative result must be combined with clinical observations, patient history,  and epidemiological information. The expected result is Negative.  Fact Sheet for Patients:  EntrepreneurPulse.com.au  Fact Sheet for Healthcare Providers:  IncredibleEmployment.be  This test is no t yet approved or cleared by the [place] FDA and  has been authorized for detection and/or diagnosis of SARS-CoV-2 by FDA under an Emergency Use Authorization (EUA). This EUA will remain  in effect (meaning this test can be used) for the duration of the COVID-19 declaration under Section 564(b)(1) of the Act, 21 U.S.C.section 360bbb-3(b)(1), unless the authorization is terminated  or revoked sooner.       Influenza A by PCR NEGATIVE NEGATIVE Final   Influenza B by PCR NEGATIVE NEGATIVE Final    Comment: (NOTE) The Xpert Xpress SARS-CoV-2/FLU/RSV plus  assay is intended as an aid in the diagnosis of influenza from Nasopharyngeal swab specimens and should not be used as a sole basis for treatment. Nasal washings and aspirates are unacceptable for Xpert Xpress SARS-CoV-2/FLU/RSV testing.  Fact Sheet for Patients: EntrepreneurPulse.com.au  Fact Sheet for Healthcare Providers: IncredibleEmployment.be  This test is not yet approved or cleared by the [place] FDA and has been authorized for detection and/or diagnosis of SARS-CoV-2 by FDA under an Emergency Use Authorization (EUA). This EUA will remain in effect (meaning this test can be used) for the duration of the COVID-19 declaration under Section 564(b)(1) of the Act, 21 U.S.C. section 360bbb-3(b)(1), unless the authorization is terminated or revoked.  Performed at [facility], [place] [address] [place], [place] [address]          Radiology Studies: CT ABDOMEN PELVIS W CONTRAST  Result Date: 07/19/2020 CLINICAL DATA:  Right upper quadrant pain and nausea. Chronic pancreatitis. EXAM: CT ABDOMEN AND PELVIS WITH CONTRAST TECHNIQUE: Multidetector CT imaging of the abdomen and pelvis was performed using the standard protocol following bolus administration of intravenous contrast. CONTRAST:  139mL OMNIPAQUE IOHEXOL 300 MG/ML  SOLN COMPARISON:  02/02/2020 FINDINGS: Lower Chest: No acute findings. Hepatobiliary: No hepatic masses identified. A few tiny sub-cm hepatic cysts remain stable. New mild diffuse gallbladder wall thickening and distension are noted. Mild diffuse intrahepatic biliary ductal dilatation is seen which is increased since previous study, with common bile duct now measuring 9 mm in diameter compared to 5 mm previously. Smooth stricture of the intrapancreatic portion of the distal common bile duct seen. Pancreas: Moderate diffuse pancreatic ductal dilatation shows increase since previous study. Increased calcifications  are seen in the pancreatic head, consistent with chronic pancreatitis. There also several new rim enhancing cystic lesions within the pancreatic head, largest measuring 2.7 cm on image 26/2. No acute peripancreatic inflammatory changes are seen. Spleen: Within normal limits in size and appearance. Adrenals/Urinary Tract: No masses identified. Bilateral renal cysts show no significant change. No evidence of ureteral calculi or hydronephrosis. Stomach/Bowel: No evidence of obstruction, inflammatory process or abnormal fluid collections. Normal appendix visualized. Vascular/Lymphatic: No pathologically enlarged lymph nodes. No acute vascular findings. Aortic atherosclerotic calcification noted. Reproductive:  Stable mildly enlarged prostate. Other:  None. Musculoskeletal:  No suspicious bone lesions identified. IMPRESSION: Increased diffuse biliary ductal dilatation, with smooth stricture of distal common bile duct, most likely due to chronic pancreatitis involving the pancreatic head. No pancreatic mass identified. Several new rim enhancing cystic lesions within the pancreatic head, largest measuring 2.7 cm, consistent with pancreatic pseudocysts. Mild diffuse gallbladder wall thickening and distension, which are nonspecific. These findings could be due to chronic pancreatitis, however cholecystitis cannot be excluded. Aortic Atherosclerosis (ICD10-I70.0). Electronically Signed   By:  Marlaine Hind M.D.   On: 07/19/2020 21:07   US ABDOMEN LIMITED RUQ (LIVER/GB)  Result Date: 07/19/2020 CLINICAL DATA:  Right upper quadrant pain EXAM: ULTRASOUND ABDOMEN LIMITED RIGHT UPPER QUADRANT COMPARISON:  06/18/2017 FINDINGS: Gallbladder: Gallbladder sludge. Mild wall thickening measuring 3.4 mm. Positive sonographic Murphy's sign. Common bile duct: Diameter: Dilated measuring 13.8 mm. On CT from 02/02/2020 the common bile duct was measured at 5 mm. Liver: No focal lesion identified. Within normal limits in parenchymal  echogenicity. Portal vein is patent on color Doppler imaging with normal direction of blood flow towards the liver. Other: 2 cm cyst identified within the right kidney. IMPRESSION: 1. Gallbladder wall thickening, gallbladder sludge and sonographic Murphy's sign. Findings may reflect acalculous cholecystitis. 2. Dilated common bile duct measuring 13.8 mm in diameter. Electronically Signed   By: Kerby Moors M.D.   On: 07/19/2020 18:19   Scheduled Meds: . aspirin  81 mg Oral BH-q7a  . atorvastatin  80 mg Oral BH-q7a  . hydrochlorothiazide  25 mg Oral BH-q7a  . lisinopril  20 mg Oral BH-q7a   Continuous Infusions: . lactated ringers 125 mL/hr at 07/19/20 2309  . piperacillin-tazobactam (ZOSYN)  IV 3.375 g (07/20/20 0443)     LOS: 1 day   Time spent: 76min  Seeley Hissong C Nam Vossler, DO [facility]  If 7PM-7AM, please contact night-coverage [email or web address]  07/20/2020, 7:13 AM

## 2020-07-20 NOTE — Transfer of Care (Signed)
Immediate Anesthesia Transfer of Care Note  Patient: Patrick Mack  Procedure(s) Performed: Procedure(s): ENDOSCOPIC RETROGRADE CHOLANGIOPANCREATOGRAPHY (ERCP) (N/A)  Patient Location: PACU  Anesthesia Type:General  Level of Consciousness: Alert, Awake, Oriented  Airway & Oxygen Therapy: Patient Spontanous Breathing  Post-op Assessment: Report given to RN  Post vital signs: Reviewed and stable  Last Vitals:  Vitals:   07/20/20 0504 07/20/20 0947  BP: 132/81 (!) 163/67  Pulse: 66 (!) 52  Resp: 16 17  Temp: 36.9 C 36.9 C  SpO2: 76% 22%    Complications: No apparent anesthesia complications

## 2020-07-20 NOTE — Consult Note (Signed)
Reason for Consult: Acute exacerbation of chronic pancreatitis, elevated liver enzymes, biliary stricture Referring Physician: [facility]  Claudina Lick HPI: This is a 70 year old male who is well-known to me for his history of chronic pancreatitis admitted for an acute exacerbation.  He started to experience acute RUQ pain shortly before lunch yesterday.  The pain was rated as a 10/10 with radiation to his right shoulder.  He continues on his healthy diet and improved lifestyle.  The work up in the ER showed a pancreatitis with a lipase at 1126.  His liver enzymes were as follows:  AST 194, ALT 303, and TB 2.5.  Today's liver enzymes and TB did improve.  A repeat scan of the abdomen showed that there was ain interval increase in the CBD from 5 mm up to 9 mm on the CT scan.  The intrapancreatic portion of the CBD was noted to have a stricture, which is consistent with his history of pancreatitis.  A new pseudocyst was identified as well as an increase in the PD diameter.  With the ultrasound his CBD was measured to be at 13 mm and there was evidence of sludge in the gallbladder as well as gallbladder wall thickening.  Overnight his liver enzymes improved and he is feeling well.  Past Medical History:  Diagnosis Date  . Brain mass   . Chronic pancreatitis ([place])   . Headache   . Hypercholesteremia   . Hypertension   . Pancreas cyst   . Stage 3a chronic kidney disease ([place])   . Tobacco dependence   . Tubular adenoma of colon     Past Surgical History:  Procedure Laterality Date  . ESOPHAGOGASTRODUODENOSCOPY (EGD) WITH PROPOFOL N/A 12/30/2019   Procedure: ESOPHAGOGASTRODUODENOSCOPY (EGD) WITH PROPOFOL;  Surgeon: Carol Ada, MD;  Location: [facility];  Service: Endoscopy;  Laterality: N/A;  . FINE NEEDLE ASPIRATION N/A 12/30/2019   Procedure: FINE NEEDLE ASPIRATION (FNA) LINEAR;  Surgeon: Carol Ada, MD;  Location: [facility];  Service: Endoscopy;  Laterality: N/A;  . UPPER  ESOPHAGEAL ENDOSCOPIC ULTRASOUND (EUS) N/A 12/30/2019   Procedure: UPPER ESOPHAGEAL ENDOSCOPIC ULTRASOUND (EUS);  Surgeon: Carol Ada, MD;  Location: [facility];  Service: Endoscopy;  Laterality: N/A;    Family History  Problem Relation Age of Onset  . Cancer Mother   . Cancer Father   . Cancer Sister   . Cancer Brother     Social History:  reports that he has quit smoking. He has never used smokeless tobacco. He reports previous alcohol use. He reports that he does not use drugs.  Allergies: No Known Allergies  Medications:  Scheduled: . [MAR Hold] aspirin  81 mg Oral BH-q7a  . [MAR Hold] atorvastatin  80 mg Oral BH-q7a  . [MAR Hold] hydrochlorothiazide  25 mg Oral BH-q7a  . [MAR Hold] lisinopril  20 mg Oral BH-q7a   Continuous: . sodium chloride    . lactated ringers 125 mL/hr at 07/20/20 1001  . [MAR Hold] piperacillin-tazobactam (ZOSYN)  IV 3.375 g (07/20/20 0443)    Results for orders placed or performed during the hospital encounter of 07/19/20 (from the past 24 hour(s))  Lipase, blood     Status: Abnormal   Collection Time: 07/19/20  5:55 PM  Result Value Ref Range   Lipase 1,126 (H) 11 - 51 U/L  Comprehensive metabolic panel     Status: Abnormal   Collection Time: 07/19/20  5:55 PM  Result Value Ref Range   Sodium 137 135 -  145 mmol/L   Potassium 4.5 3.5 - 5.1 mmol/L   Chloride 103 98 - 111 mmol/L   CO2 20 (L) 22 - 32 mmol/L   Glucose, Bld 95 70 - 99 mg/dL   BUN 22 8 - 23 mg/dL   Creatinine, Ser 1.24 0.61 - 1.24 mg/dL   Calcium 9.8 8.9 - 10.3 mg/dL   Total Protein 7.8 6.5 - 8.1 g/dL   Albumin 4.6 3.5 - 5.0 g/dL   AST 184 (H) 15 - 41 U/L   ALT 303 (H) 0 - 44 U/L   Alkaline Phosphatase 251 (H) 38 - 126 U/L   Total Bilirubin 2.5 (H) 0.3 - 1.2 mg/dL   GFR, Estimated >60 >60 mL/min   Anion gap 14 5 - 15  CBC with Differential     Status: Abnormal   Collection Time: 07/19/20  5:55 PM  Result Value Ref Range   WBC 14.9 (H) 4.0 - 10.5 K/uL   RBC 4.88 4.22  - 5.81 MIL/uL   Hemoglobin 15.8 13.0 - 17.0 g/dL   HCT 46.6 39.0 - 52.0 %   MCV 95.5 80.0 - 100.0 fL   MCH 32.4 26.0 - 34.0 pg   MCHC 33.9 30.0 - 36.0 g/dL   RDW 14.0 11.5 - 15.5 %   Platelets 321 150 - 400 K/uL   nRBC 0.0 0.0 - 0.2 %   Neutrophils Relative % 82 %   Neutro Abs 12.2 (H) 1.7 - 7.7 K/uL   Lymphocytes Relative 10 %   Lymphs Abs 1.5 0.7 - 4.0 K/uL   Monocytes Relative 5 %   Monocytes Absolute 0.7 0.1 - 1.0 K/uL   Eosinophils Relative 2 %   Eosinophils Absolute 0.3 0.0 - 0.5 K/uL   Basophils Relative 1 %   Basophils Absolute 0.1 0.0 - 0.1 K/uL   Immature Granulocytes 0 %   Abs Immature Granulocytes 0.06 0.00 - 0.07 K/uL  Troponin I (High Sensitivity)     Status: None   Collection Time: 07/19/20  5:55 PM  Result Value Ref Range   Troponin I (High Sensitivity) 5 <18 ng/L  Urinalysis, Routine w reflex microscopic Urine, Clean Catch     Status: Abnormal   Collection Time: 07/19/20  7:21 PM  Result Value Ref Range   Color, Urine YELLOW YELLOW   APPearance CLEAR CLEAR   Specific Gravity, Urine 1.011 1.005 - 1.030   pH 7.0 5.0 - 8.0   Glucose, UA NEGATIVE NEGATIVE mg/dL   Hgb urine dipstick NEGATIVE NEGATIVE   Bilirubin Urine NEGATIVE NEGATIVE   Ketones, ur NEGATIVE NEGATIVE mg/dL   Protein, ur 30 (A) NEGATIVE mg/dL   Nitrite NEGATIVE NEGATIVE   Leukocytes,Ua NEGATIVE NEGATIVE   RBC / HPF 0-5 0 - 5 RBC/hpf   WBC, UA 0-5 0 - 5 WBC/hpf   Bacteria, UA RARE (A) NONE SEEN   Mucus PRESENT   Resp Panel by RT-PCR (Flu A&B, Covid) Nasopharyngeal Swab     Status: None   Collection Time: 07/19/20  7:27 PM   Specimen: Nasopharyngeal Swab; Nasopharyngeal(NP) swabs in vial transport medium  Result Value Ref Range   SARS Coronavirus 2 by RT PCR NEGATIVE NEGATIVE   Influenza A by PCR NEGATIVE NEGATIVE   Influenza B by PCR NEGATIVE NEGATIVE  Troponin I (High Sensitivity)     Status: None   Collection Time: 07/19/20  7:29 PM  Result Value Ref Range   Troponin I (High  Sensitivity) 6 <18 ng/L  CBC  Status: Abnormal   Collection Time: 07/20/20  3:38 AM  Result Value Ref Range   WBC 13.1 (H) 4.0 - 10.5 K/uL   RBC 3.92 (L) 4.22 - 5.81 MIL/uL   Hemoglobin 12.6 (L) 13.0 - 17.0 g/dL   HCT 37.6 (L) 39.0 - 52.0 %   MCV 95.9 80.0 - 100.0 fL   MCH 32.1 26.0 - 34.0 pg   MCHC 33.5 30.0 - 36.0 g/dL   RDW 14.0 11.5 - 15.5 %   Platelets 255 150 - 400 K/uL   nRBC 0.0 0.0 - 0.2 %  Comprehensive metabolic panel     Status: Abnormal   Collection Time: 07/20/20  3:38 AM  Result Value Ref Range   Sodium 139 135 - 145 mmol/L   Potassium 4.3 3.5 - 5.1 mmol/L   Chloride 105 98 - 111 mmol/L   CO2 23 22 - 32 mmol/L   Glucose, Bld 97 70 - 99 mg/dL   BUN 23 8 - 23 mg/dL   Creatinine, Ser 1.48 (H) 0.61 - 1.24 mg/dL   Calcium 9.3 8.9 - 10.3 mg/dL   Total Protein 6.7 6.5 - 8.1 g/dL   Albumin 4.2 3.5 - 5.0 g/dL   AST 94 (H) 15 - 41 U/L   ALT 213 (H) 0 - 44 U/L   Alkaline Phosphatase 208 (H) 38 - 126 U/L   Total Bilirubin 1.0 0.3 - 1.2 mg/dL   GFR, Estimated 51 (L) >60 mL/min   Anion gap 11 5 - 15     CT ABDOMEN PELVIS W CONTRAST  Result Date: 07/19/2020 CLINICAL DATA:  Right upper quadrant pain and nausea. Chronic pancreatitis. EXAM: CT ABDOMEN AND PELVIS WITH CONTRAST TECHNIQUE: Multidetector CT imaging of the abdomen and pelvis was performed using the standard protocol following bolus administration of intravenous contrast. CONTRAST:  170mL OMNIPAQUE IOHEXOL 300 MG/ML  SOLN COMPARISON:  02/02/2020 FINDINGS: Lower Chest: No acute findings. Hepatobiliary: No hepatic masses identified. A few tiny sub-cm hepatic cysts remain stable. New mild diffuse gallbladder wall thickening and distension are noted. Mild diffuse intrahepatic biliary ductal dilatation is seen which is increased since previous study, with common bile duct now measuring 9 mm in diameter compared to 5 mm previously. Smooth stricture of the intrapancreatic portion of the distal common bile duct seen. Pancreas:  Moderate diffuse pancreatic ductal dilatation shows increase since previous study. Increased calcifications are seen in the pancreatic head, consistent with chronic pancreatitis. There also several new rim enhancing cystic lesions within the pancreatic head, largest measuring 2.7 cm on image 26/2. No acute peripancreatic inflammatory changes are seen. Spleen: Within normal limits in size and appearance. Adrenals/Urinary Tract: No masses identified. Bilateral renal cysts show no significant change. No evidence of ureteral calculi or hydronephrosis. Stomach/Bowel: No evidence of obstruction, inflammatory process or abnormal fluid collections. Normal appendix visualized. Vascular/Lymphatic: No pathologically enlarged lymph nodes. No acute vascular findings. Aortic atherosclerotic calcification noted. Reproductive:  Stable mildly enlarged prostate. Other:  None. Musculoskeletal:  No suspicious bone lesions identified. IMPRESSION: Increased diffuse biliary ductal dilatation, with smooth stricture of distal common bile duct, most likely due to chronic pancreatitis involving the pancreatic head. No pancreatic mass identified. Several new rim enhancing cystic lesions within the pancreatic head, largest measuring 2.7 cm, consistent with pancreatic pseudocysts. Mild diffuse gallbladder wall thickening and distension, which are nonspecific. These findings could be due to chronic pancreatitis, however cholecystitis cannot be excluded. Aortic Atherosclerosis (ICD10-I70.0). Electronically Signed   By: Marlaine Hind M.D.   On: 07/19/2020  21:07   US ABDOMEN LIMITED RUQ (LIVER/GB)  Result Date: 07/19/2020 CLINICAL DATA:  Right upper quadrant pain EXAM: ULTRASOUND ABDOMEN LIMITED RIGHT UPPER QUADRANT COMPARISON:  06/18/2017 FINDINGS: Gallbladder: Gallbladder sludge. Mild wall thickening measuring 3.4 mm. Positive sonographic Murphy's sign. Common bile duct: Diameter: Dilated measuring 13.8 mm. On CT from 02/02/2020 the common bile  duct was measured at 5 mm. Liver: No focal lesion identified. Within normal limits in parenchymal echogenicity. Portal vein is patent on color Doppler imaging with normal direction of blood flow towards the liver. Other: 2 cm cyst identified within the right kidney. IMPRESSION: 1. Gallbladder wall thickening, gallbladder sludge and sonographic Murphy's sign. Findings may reflect acalculous cholecystitis. 2. Dilated common bile duct measuring 13.8 mm in diameter. Electronically Signed   By: Kerby Moors M.D.   On: 07/19/2020 18:19    ROS:  As stated above in the HPI otherwise negative.  Blood pressure (!) 163/67, pulse (!) 52, temperature 98.4 F (36.9 C), temperature source Oral, resp. rate 17, height 5\' 11"  (1.803 m), weight 74.8 kg, SpO2 96 %.    PE: Gen: NAD, Alert and Oriented HEENT:  [place]/AT, EOMI Neck: Supple, no LAD Lungs: CTA Bilaterally CV: RRR without M/G/R ABD: Soft, NTND, +BS Ext: No C/C/E  Assessment/Plan: 1) Acute exacerbation of pancreatitis. 2) Chronic pancreatitis. 3) Biliary stricture - benign appearing. 4) Biliary sludge. 5) ? Acalculous cholecystitis.   The suspicion is that he has a biliary pancreatitis secondary to sludge lodging in the biliary stricture.  The stricture appears benign and it is consistent with his history of chronic pancreatitis.  The plan will be to insert a stent as well as brushings today to exclude any malignant source for the stricture.  He will benefit in the future with sequential stenting to increase the size of the stricture.  The patient will need a surgical evaluation for the possibility of an acalculous cholecystitis.  Jazzmen Restivo D 07/20/2020, 9:55 AM

## 2020-07-20 NOTE — Anesthesia Postprocedure Evaluation (Signed)
Anesthesia Post Note  Patient: Patrick Mack  Procedure(s) Performed: ENDOSCOPIC RETROGRADE CHOLANGIOPANCREATOGRAPHY (ERCP) (N/A ) SPHINCTEROTOMY BILIARY STENT PLACEMENT (N/A )     Patient location during evaluation: PACU Anesthesia Type: General Level of consciousness: awake and alert Pain management: pain level controlled Vital Signs Assessment: post-procedure vital signs reviewed and stable Respiratory status: spontaneous breathing, nonlabored ventilation, respiratory function stable and patient connected to nasal cannula oxygen Cardiovascular status: blood pressure returned to baseline and stable Postop Assessment: no apparent nausea or vomiting Anesthetic complications: no   No complications documented.  Last Vitals:  Vitals:   07/20/20 0947 07/20/20 1130  BP: (!) 163/67 (!) 132/54  Pulse: (!) 52 64  Resp: 17 18  Temp: 36.9 C 36.6 C  SpO2: 96% 99%    Last Pain:  Vitals:   07/20/20 1130  TempSrc: Oral  PainSc: Asleep                 Nana Hoselton S

## 2020-07-20 NOTE — Anesthesia Procedure Notes (Signed)
Procedure Name: Intubation Date/Time: 07/20/2020 10:11 AM Performed by: Cynda Familia, CRNA Pre-anesthesia Checklist: Patient identified, Emergency Drugs available, Suction available and Patient being monitored Patient Re-evaluated:Patient Re-evaluated prior to induction Oxygen Delivery Method: Circle System Utilized Preoxygenation: Pre-oxygenation with 100% oxygen Induction Type: IV induction Ventilation: Mask ventilation without difficulty Laryngoscope Size: Miller and 2 Grade View: Grade I Tube type: Oral Tube size: 7.5 mm Number of attempts: 1 Airway Equipment and Method: Stylet Placement Confirmation: ETT inserted through vocal cords under direct vision,  positive ETCO2 and breath sounds checked- equal and bilateral Secured at: 23 cm Tube secured with: Tape Dental Injury: Teeth and Oropharynx as per pre-operative assessment  Comments: Smooth IV induction Rose--intubation AM CRNA atraumatic-- teeth and mouth as preop- bilat BS

## 2020-07-20 NOTE — Anesthesia Preprocedure Evaluation (Signed)
Anesthesia Evaluation  Patient identified by MRN, date of birth, ID band Patient awake    Reviewed: Allergy & Precautions, NPO status , Patient's Chart, lab work & pertinent test results  Airway Mallampati: II  TM Distance: >3 FB Neck ROM: Full    Dental no notable dental hx.    Pulmonary neg pulmonary ROS, former smoker,    Pulmonary exam normal breath sounds clear to auscultation       Cardiovascular hypertension, Normal cardiovascular exam Rhythm:Regular Rate:Normal     Neuro/Psych negative neurological ROS  negative psych ROS   GI/Hepatic negative GI ROS, Chronic pancreatitis   Endo/Other  negative endocrine ROS  Renal/GU Renal InsufficiencyRenal disease  negative genitourinary   Musculoskeletal negative musculoskeletal ROS (+)   Abdominal   Peds negative pediatric ROS (+)  Hematology negative hematology ROS (+)   Anesthesia Other Findings   Reproductive/Obstetrics negative OB ROS                             Anesthesia Physical Anesthesia Plan  ASA: III  Anesthesia Plan: General   Post-op Pain Management:    Induction: Intravenous  PONV Risk Score and Plan: 2 and Ondansetron, Dexamethasone and Treatment may vary due to age or medical condition  Airway Management Planned: Oral ETT  Additional Equipment:   Intra-op Plan:   Post-operative Plan: Extubation in OR  Informed Consent: I have reviewed the patients History and Physical, chart, labs and discussed the procedure including the risks, benefits and alternatives for the proposed anesthesia with the patient or authorized representative who has indicated his/her understanding and acceptance.     Dental advisory given  Plan Discussed with: CRNA and Surgeon  Anesthesia Plan Comments:         Anesthesia Quick Evaluation

## 2020-07-20 NOTE — Op Note (Signed)
[facility] Patient Name: Patrick Mack Procedure Date: 07/20/2020 MRN: [id number] Attending MD: Carol Ada , MD Date of Birth: [birth year] CSN: [id number] Age: 70 Admit Type: Inpatient Procedure:                ERCP Indications:              Benign stricture of the common bile duct Providers:                Carol Ada, MD, Kary Kos RN, RN, Tyna Jaksch Technician Referring MD:              Medicines:                General Anesthesia Complications:            No immediate complications. Estimated Blood Loss:     Estimated blood loss: none. Procedure:                Pre-Anesthesia Assessment:                           - Prior to the procedure, a History and Physical                            was performed, and patient medications and                            allergies were reviewed. The patient's tolerance of                            previous anesthesia was also reviewed. The risks                            and benefits of the procedure and the sedation                            options and risks were discussed with the patient.                            All questions were answered, and informed consent                            was obtained. Prior Anticoagulants: The patient has                            taken no previous anticoagulant or antiplatelet                            agents. ASA Grade Assessment: III - A patient with                            severe systemic disease. After reviewing the risks  and benefits, the patient was deemed in                            satisfactory condition to undergo the procedure.                           - General anesthesia was attained.                           After obtaining informed consent, the scope was                            passed under direct vision. Throughout the                            procedure, the patient's blood pressure, pulse, and                             oxygen saturations were monitored continuously. The                            ERCP [id number] was introduced through the mouth, and                            used to inject contrast into and used to inject                            contrast into the bile duct and dorsal pancreatic                            duct. The ERCP was technically difficult and                            complex. The patient tolerated the procedure well. Scope In: Scope Out: Findings:      The major papilla was normal. A short 0.035 inch Soft Jagwire was passed       into the biliary tree. A 10 mm biliary sphincterotomy was made with a       traction (standard) sphincterotome using ERBE electrocautery. There was       no post-sphincterotomy bleeding. One 8.5 Fr by 5 cm stent with a single       external flap and a single internal flap was placed 4 cm into the common       bile duct. Bile and sludge flowed through the stent. The stent was in       good position.      Cannulation of the CBD was difficult. The PD was cannulated multiple       times and a two wire technique was applied. The first attempt with the       two wire technique failed using two 0.035 Jagwires. With the second       attempt the 0.035 Jagwire was secured in the proximal PD as it was       difficult to advance further. This also facilitated drainage of the       pancreatic juices. With the Revolution wire  the CBD was ultimately able       to be cannulated. Contrast injection revealed a 4-5 cm smooth stricture.       Proximal to this area the CBD was dilated at 10-12 mm. Brushings were       not performed at this time as this was a difficult cannulation. The 8.5       Fr x 5 cm plastic biliary stent was inserted and excellent drainage was       achieved. There was a copious amount of sludge that drained from the       stent. Impression:               - The major papilla appeared normal.                           - A  biliary sphincterotomy was performed.                           - One stent was placed into the common bile duct. Moderate Sedation:      Not Applicable - Patient had care per Anesthesia. Recommendation:           - Return patient to hospital ward for ongoing care.                           - Clear liquid diet.                           - Surgical consultation for lap chole.                           - Repeat ERCP in 3 months for repeat stent                            insertion to increase the diameter of the stricture.                           - Brushings at the time of the repeat ERCP. Procedure Code(s):        --- Professional ---                           [phone], Endoscopic retrograde                            cholangiopancreatography (ERCP); with placement of                            endoscopic stent into biliary or pancreatic duct,                            including pre- and post-dilation and guide wire                            passage, when performed, including sphincterotomy,                            when performed, each stent  Diagnosis Code(s):        --- Professional ---                           K83.1, Obstruction of bile duct CPT copyright 2019 [employer]. All rights reserved. The codes documented in this report are preliminary and upon coder review may  be revised to meet current compliance requirements. Carol Ada, MD Carol Ada, MD 07/20/2020 11:38:26 AM This report has been signed electronically. Number of Addenda: 0

## 2020-07-21 DIAGNOSIS — Z9889 Other specified postprocedural states: Secondary | ICD-10-CM | POA: Diagnosis not present

## 2020-07-21 DIAGNOSIS — R945 Abnormal results of liver function studies: Secondary | ICD-10-CM

## 2020-07-21 DIAGNOSIS — K831 Obstruction of bile duct: Secondary | ICD-10-CM

## 2020-07-21 DIAGNOSIS — K8050 Calculus of bile duct without cholangitis or cholecystitis without obstruction: Secondary | ICD-10-CM | POA: Diagnosis not present

## 2020-07-21 DIAGNOSIS — K810 Acute cholecystitis: Secondary | ICD-10-CM | POA: Diagnosis not present

## 2020-07-21 LAB — COMPREHENSIVE METABOLIC PANEL
ALT: 131 U/L — ABNORMAL HIGH (ref 0–44)
AST: 46 U/L — ABNORMAL HIGH (ref 15–41)
Albumin: 3.4 g/dL — ABNORMAL LOW (ref 3.5–5.0)
Alkaline Phosphatase: 153 U/L — ABNORMAL HIGH (ref 38–126)
Anion gap: 10 (ref 5–15)
BUN: 26 mg/dL — ABNORMAL HIGH (ref 8–23)
CO2: 22 mmol/L (ref 22–32)
Calcium: 9 mg/dL (ref 8.9–10.3)
Chloride: 103 mmol/L (ref 98–111)
Creatinine, Ser: 1.56 mg/dL — ABNORMAL HIGH (ref 0.61–1.24)
GFR, Estimated: 48 mL/min — ABNORMAL LOW (ref >60)
Glucose, Bld: 123 mg/dL — ABNORMAL HIGH (ref 70–99)
Potassium: 4.5 mmol/L (ref 3.5–5.1)
Sodium: 135 mmol/L (ref 135–145)
Total Bilirubin: 0.7 mg/dL (ref 0.3–1.2)
Total Protein: 5.9 g/dL — ABNORMAL LOW (ref 6.5–8.1)

## 2020-07-21 LAB — CBC
HCT: 30.2 % — ABNORMAL LOW (ref 39.0–52.0)
Hemoglobin: 10.4 g/dL — ABNORMAL LOW (ref 13.0–17.0)
MCH: 32.4 pg (ref 26.0–34.0)
MCHC: 34.4 g/dL (ref 30.0–36.0)
MCV: 94.1 fL (ref 80.0–100.0)
Platelets: 181 10*3/uL (ref 150–400)
RBC: 3.21 MIL/uL — ABNORMAL LOW (ref 4.22–5.81)
RDW: 13.3 % (ref 11.5–15.5)
WBC: 11.9 10*3/uL — ABNORMAL HIGH (ref 4.0–10.5)
nRBC: 0 % (ref 0.0–0.2)

## 2020-07-21 LAB — LIPASE, BLOOD: Lipase: 30 U/L (ref 11–51)

## 2020-07-21 MED ORDER — ASPIRIN 81 MG PO CHEW: 81.0000 mg | CHEWABLE_TABLET | ORAL | 0 refills | Status: AC

## 2020-07-21 MED ORDER — LISINOPRIL 20 MG PO TABS
20.0000 mg | ORAL_TABLET | Freq: Every day | ORAL | Status: DC
  Administered 2020-07-21: 20 mg via ORAL
  Filled 2020-07-21: qty 1

## 2020-07-21 MED ORDER — HYDROCHLOROTHIAZIDE 25 MG PO TABS
25.0000 mg | ORAL_TABLET | Freq: Every day | ORAL | Status: DC
  Administered 2020-07-21: 25 mg via ORAL
  Filled 2020-07-21: qty 1

## 2020-07-21 MED ORDER — ASPIRIN 81 MG PO CHEW
81.0000 mg | CHEWABLE_TABLET | Freq: Every day | ORAL | Status: DC
  Administered 2020-07-21: 81 mg via ORAL
  Filled 2020-07-21: qty 1

## 2020-07-21 MED ORDER — ATORVASTATIN CALCIUM 40 MG PO TABS
80.0000 mg | ORAL_TABLET | Freq: Every day | ORAL | Status: DC
  Administered 2020-07-21: 80 mg via ORAL
  Filled 2020-07-21: qty 2

## 2020-07-21 NOTE — Progress Notes (Signed)
Pt alert and oriented, tolerating diet. D/C instructions given, pt d/cd to home. 

## 2020-07-21 NOTE — Discharge Summary (Signed)
Physician Discharge Summary  Patrick Mack VHQ:[id number] DOB: 07-Dec-1950 DOA: 07/19/2020  PCP: Jolinda Croak, MD  Admit date: 07/19/2020 Discharge date: 07/21/2020  Admitted From: Home Disposition: Home  Recommendations for Outpatient Follow-up:  1. Follow up with PCP in 1-2 weeks 2. Please obtain BMP/CBC in one week 3. Please follow up with GI and general surgery as scheduled  Home Health: None Equipment/Devices: None none  Discharge Condition: Stable CODE STATUS: Full Diet recommendation:    Brief/Interim Summary: Patrick Mack a 70 y.o.malewith medical history significant forHTN, chronic pancreatitis, HLD who presents for evaluation of RUQ abdominal pain.  Patient and sister at bedside indicate acute sharp right upper quadrant pain with associated nausea but no vomiting.  Similar to previous episodes of pancreatitis.  Patient reported to the ED nearly immediately given no history of pancreatitis for further treatment and evaluation.  In the ED patient had leukocytosis, lipase over thousand with minimally elevated AST ALT and bilirubin, CT abdomen notable for pseudocyst in the pancreatic head and dilated common bile duct.  Right upper quadrant ultrasound indicative of thickened gallbladder wall with sludge and dilated common bile duct, GI called for consult, hospitalist called for admission.  Patient admitted as above with acute abdominal pain, initial imaging concerning for dilated common bile duct with subsequent gallbladder wall thickening.  Patient successfully underwent ERCP with stent placement with GI, lengthy discussion with patient and his sister at bedside about possible need for surgical evaluation, they reasonably requested this be done in an outpatient setting given his resolution of symptoms, able to tolerate p.o. today without any difficulty and otherwise feels back to baseline.  We explained the need for ongoing monitoring of symptoms at home, to continue his  appropriate diet, avoid alcohol and fatty food intake.  He will follow-up with GI as scheduled for further evaluation and discussion about revisiting stent placement for resizing in the near future.  Patient otherwise stable and agreeable for discharge home.  Discharge Diagnoses:  Principal Problem:   Acute acalculous cholecystitis Active Problems:   Pancreatitis   Essential hypertension   Leukocytosis    Discharge Instructions  Discharge Instructions    Call MD for:  severe uncontrolled pain   Complete by: As directed    Diet - low sodium heart healthy   Complete by: As directed    Increase activity slowly   Complete by: As directed      Allergies as of 07/21/2020   No Known Allergies     Medication List    STOP taking these medications   oxyCODONE-acetaminophen 5-325 MG tablet Commonly known as: PERCOCET/ROXICET   rizatriptan 10 MG disintegrating tablet Commonly known as: MAXALT-MLT     TAKE these medications   aspirin 81 MG chewable tablet Chew 1 tablet (81 mg total) by mouth every morning.   atorvastatin 80 MG tablet Commonly known as: LIPITOR Take 80 mg by mouth every morning.   gabapentin 300 MG capsule Commonly known as: NEURONTIN Take 300 mg by mouth at bedtime.   hydrochlorothiazide 25 MG tablet Commonly known as: HYDRODIURIL Take 25 mg by mouth every morning.   lisinopril 20 MG tablet Commonly known as: ZESTRIL Take 20 mg by mouth every morning.       No Known Allergies  Consultations: GI Dr. Benson Norway  Procedures/Studies: CT ABDOMEN PELVIS W CONTRAST  Result Date: 07/19/2020 CLINICAL DATA:  Right upper quadrant pain and nausea. Chronic pancreatitis. EXAM: CT ABDOMEN AND PELVIS WITH CONTRAST TECHNIQUE: Multidetector CT imaging of the  abdomen and pelvis was performed using the standard protocol following bolus administration of intravenous contrast. CONTRAST:  155mL OMNIPAQUE IOHEXOL 300 MG/ML  SOLN COMPARISON:  02/02/2020 FINDINGS: Lower Chest: No  acute findings. Hepatobiliary: No hepatic masses identified. A few tiny sub-cm hepatic cysts remain stable. New mild diffuse gallbladder wall thickening and distension are noted. Mild diffuse intrahepatic biliary ductal dilatation is seen which is increased since previous study, with common bile duct now measuring 9 mm in diameter compared to 5 mm previously. Smooth stricture of the intrapancreatic portion of the distal common bile duct seen. Pancreas: Moderate diffuse pancreatic ductal dilatation shows increase since previous study. Increased calcifications are seen in the pancreatic head, consistent with chronic pancreatitis. There also several new rim enhancing cystic lesions within the pancreatic head, largest measuring 2.7 cm on image 26/2. No acute peripancreatic inflammatory changes are seen. Spleen: Within normal limits in size and appearance. Adrenals/Urinary Tract: No masses identified. Bilateral renal cysts show no significant change. No evidence of ureteral calculi or hydronephrosis. Stomach/Bowel: No evidence of obstruction, inflammatory process or abnormal fluid collections. Normal appendix visualized. Vascular/Lymphatic: No pathologically enlarged lymph nodes. No acute vascular findings. Aortic atherosclerotic calcification noted. Reproductive:  Stable mildly enlarged prostate. Other:  None. Musculoskeletal:  No suspicious bone lesions identified. IMPRESSION: Increased diffuse biliary ductal dilatation, with smooth stricture of distal common bile duct, most likely due to chronic pancreatitis involving the pancreatic head. No pancreatic mass identified. Several new rim enhancing cystic lesions within the pancreatic head, largest measuring 2.7 cm, consistent with pancreatic pseudocysts. Mild diffuse gallbladder wall thickening and distension, which are nonspecific. These findings could be due to chronic pancreatitis, however cholecystitis cannot be excluded. Aortic Atherosclerosis (ICD10-I70.0).  Electronically Signed   By: Marlaine Hind M.D.   On: 07/19/2020 21:07   DG ERCP BILIARY & PANCREATIC DUCTS  Result Date: 07/20/2020 CLINICAL DATA:  Biliary obstruction EXAM: ERCP TECHNIQUE: Multiple spot images obtained with the fluoroscopic device and submitted for interpretation post-procedure. FLUOROSCOPY TIME:  Fluoroscopy Time:  4 minutes 48 seconds Radiation Exposure Index (if provided by the fluoroscopic device): 91.5 mGy COMPARISON:  None. FINDINGS: A total of 8 intraoperative spot images are submitted for review. The images demonstrate a flexible endoscope in the descending duodenum with wire cannulation of the main pancreatic duct followed by wire cannulation of the common bile duct. Subsequent cholangiogram demonstrates intra and extrahepatic biliary ductal dilatation with high-grade stenosis/stricture of the distal common bile duct. The final images demonstrate placement of a plastic biliary stent. IMPRESSION: 1. Moderate intra and extrahepatic biliary ductal dilatation with a moderate length stenosis of the mid and distal common bile duct. 2. Placement of plastic biliary stent. These images were submitted for radiologic interpretation only. Please see the procedural report for the amount of contrast and the fluoroscopy time utilized. Electronically Signed   By: Jacqulynn Cadet M.D.   On: 07/20/2020 12:33   US ABDOMEN LIMITED RUQ (LIVER/GB)  Result Date: 07/19/2020 CLINICAL DATA:  Right upper quadrant pain EXAM: ULTRASOUND ABDOMEN LIMITED RIGHT UPPER QUADRANT COMPARISON:  06/18/2017 FINDINGS: Gallbladder: Gallbladder sludge. Mild wall thickening measuring 3.4 mm. Positive sonographic Murphy's sign. Common bile duct: Diameter: Dilated measuring 13.8 mm. On CT from 02/02/2020 the common bile duct was measured at 5 mm. Liver: No focal lesion identified. Within normal limits in parenchymal echogenicity. Portal vein is patent on color Doppler imaging with normal direction of blood flow towards the  liver. Other: 2 cm cyst identified within the right kidney. IMPRESSION: 1. Gallbladder wall  thickening, gallbladder sludge and sonographic Murphy's sign. Findings may reflect acalculous cholecystitis. 2. Dilated common bile duct measuring 13.8 mm in diameter. Electronically Signed   By: Kerby Moors M.D.   On: 07/19/2020 18:19      Subjective: No acute issues or events overnight denies nausea vomiting diarrhea constipation headache fevers chills chest pain shortness of breath abdominal pain.   Discharge Exam: Vitals:   07/21/20 0128 07/21/20 0527  BP: 121/69 123/74  Pulse: (!) 59 65  Resp: 18 18  Temp: 98.1 F (36.7 C) 98.2 F (36.8 C)  SpO2: 95% 97%   Vitals:   07/20/20 1730 07/20/20 2019 07/21/20 0128 07/21/20 0527  BP: 112/74 117/69 121/69 123/74  Pulse: (!) 58 61 (!) 59 65  Resp:  18 18 18   Temp: 97.6 F (36.4 C) 98.1 F (36.7 C) 98.1 F (36.7 C) 98.2 F (36.8 C)  TempSrc: Oral Oral Oral Oral  SpO2: 98% 98% 95% 97%  Weight:      Height:        General: Pt is alert, awake, not in acute distress Cardiovascular: RRR, S1/S2 +, no rubs, no gallops Respiratory: CTA bilaterally, no wheezing, no rhonchi Abdominal: Soft, NT, ND, bowel sounds + Extremities: no edema, no cyanosis    The results of significant diagnostics from this hospitalization (including imaging, microbiology, ancillary and laboratory) are listed below for reference.     Microbiology: Recent Results (from the past 240 hour(s))  Resp Panel by RT-PCR (Flu A&B, Covid) Nasopharyngeal Swab     Status: None   Collection Time: 07/19/20  7:27 PM   Specimen: Nasopharyngeal Swab; Nasopharyngeal(NP) swabs in vial transport medium  Result Value Ref Range Status   SARS Coronavirus 2 by RT PCR NEGATIVE NEGATIVE Final    Comment: (NOTE) SARS-CoV-2 target nucleic acids are NOT DETECTED.  The SARS-CoV-2 RNA is generally detectable in upper respiratory specimens during the acute phase of infection. The  lowest concentration of SARS-CoV-2 viral copies this assay can detect is 138 copies/mL. A negative result does not preclude SARS-Cov-2 infection and should not be used as the sole basis for treatment or other patient management decisions. A negative result may occur with  improper specimen collection/handling, submission of specimen other than nasopharyngeal swab, presence of viral mutation(s) within the areas targeted by this assay, and inadequate number of viral copies(<138 copies/mL). A negative result must be combined with clinical observations, patient history, and epidemiological information. The expected result is Negative.  Fact Sheet for Patients:  EntrepreneurPulse.com.au  Fact Sheet for Healthcare Providers:  IncredibleEmployment.be  This test is no t yet approved or cleared by the [place] FDA and  has been authorized for detection and/or diagnosis of SARS-CoV-2 by FDA under an Emergency Use Authorization (EUA). This EUA will remain  in effect (meaning this test can be used) for the duration of the COVID-19 declaration under Section 564(b)(1) of the Act, 21 U.S.C.section 360bbb-3(b)(1), unless the authorization is terminated  or revoked sooner.       Influenza A by PCR NEGATIVE NEGATIVE Final   Influenza B by PCR NEGATIVE NEGATIVE Final    Comment: (NOTE) The Xpert Xpress SARS-CoV-2/FLU/RSV plus assay is intended as an aid in the diagnosis of influenza from Nasopharyngeal swab specimens and should not be used as a sole basis for treatment. Nasal washings and aspirates are unacceptable for Xpert Xpress SARS-CoV-2/FLU/RSV testing.  Fact Sheet for Patients: EntrepreneurPulse.com.au  Fact Sheet for Healthcare Providers: IncredibleEmployment.be  This test is not yet approved or  cleared by the [place] and has been authorized for detection and/or diagnosis of SARS-CoV-2 by FDA under  an Emergency Use Authorization (EUA). This EUA will remain in effect (meaning this test can be used) for the duration of the COVID-19 declaration under Section 564(b)(1) of the Act, 21 U.S.C. section 360bbb-3(b)(1), unless the authorization is terminated or revoked.  Performed at [facility], [place] [address], [place], [place] [address]      Labs: BNP (last 3 results) No results for input(s): BNP in the last 8760 hours. Basic Metabolic Panel: Recent Labs  Lab 07/19/20 1755 07/20/20 0338 07/21/20 0525  NA 137 139 135  K 4.5 4.3 4.5  CL 103 105 103  CO2 20* 23 22  GLUCOSE 95 97 123*  BUN 22 23 26*  CREATININE 1.24 1.48* 1.56*  CALCIUM 9.8 9.3 9.0   Liver Function Tests: Recent Labs  Lab 07/19/20 1755 07/20/20 0338 07/21/20 0525  AST 184* 94* 46*  ALT 303* 213* 131*  ALKPHOS 251* 208* 153*  BILITOT 2.5* 1.0 0.7  PROT 7.8 6.7 5.9*  ALBUMIN 4.6 4.2 3.4*   Recent Labs  Lab 07/19/20 1755 07/21/20 0525  LIPASE 1,126* 30   No results for input(s): AMMONIA in the last 168 hours. CBC: Recent Labs  Lab 07/19/20 1755 07/20/20 0338 07/21/20 0525  WBC 14.9* 13.1* 11.9*  NEUTROABS 12.2*  --   --   HGB 15.8 12.6* 10.4*  HCT 46.6 37.6* 30.2*  MCV 95.5 95.9 94.1  PLT 321 255 181   Cardiac Enzymes: No results for input(s): CKTOTAL, CKMB, CKMBINDEX, TROPONINI in the last 168 hours. BNP: Invalid input(s): POCBNP CBG: No results for input(s): GLUCAP in the last 168 hours. D-Dimer No results for input(s): DDIMER in the last 72 hours. Hgb A1c No results for input(s): HGBA1C in the last 72 hours. Lipid Profile No results for input(s): CHOL, HDL, LDLCALC, TRIG, CHOLHDL, LDLDIRECT in the last 72 hours. Thyroid function studies No results for input(s): TSH, T4TOTAL, T3FREE, THYROIDAB in the last 72 hours.  Invalid input(s): FREET3 Anemia work up No results for input(s): VITAMINB12, FOLATE, FERRITIN, TIBC, IRON, RETICCTPCT in the last 72  hours. Urinalysis    Component Value Date/Time   COLORURINE YELLOW 07/19/2020 1921   APPEARANCEUR CLEAR 07/19/2020 1921   LABSPEC 1.011 07/19/2020 1921   PHURINE 7.0 07/19/2020 1921   GLUCOSEU NEGATIVE 07/19/2020 1921   HGBUR NEGATIVE 07/19/2020 [place] NEGATIVE 07/19/2020 [place] NEGATIVE 07/19/2020 1921   PROTEINUR 30 (A) 07/19/2020 1921   NITRITE NEGATIVE 07/19/2020 1921   LEUKOCYTESUR NEGATIVE 07/19/2020 1921   Sepsis Labs Invalid input(s): PROCALCITONIN,  WBC,  LACTICIDVEN Microbiology Recent Results (from the past 240 hour(s))  Resp Panel by RT-PCR (Flu A&B, Covid) Nasopharyngeal Swab     Status: None   Collection Time: 07/19/20  7:27 PM   Specimen: Nasopharyngeal Swab; Nasopharyngeal(NP) swabs in vial transport medium  Result Value Ref Range Status   SARS Coronavirus 2 by RT PCR NEGATIVE NEGATIVE Final    Comment: (NOTE) SARS-CoV-2 target nucleic acids are NOT DETECTED.  The SARS-CoV-2 RNA is generally detectable in upper respiratory specimens during the acute phase of infection. The lowest concentration of SARS-CoV-2 viral copies this assay can detect is 138 copies/mL. A negative result does not preclude SARS-Cov-2 infection and should not be used as the sole basis for treatment or other patient management decisions. A negative result may occur with  improper specimen collection/handling, submission of specimen  other than nasopharyngeal swab, presence of viral mutation(s) within the areas targeted by this assay, and inadequate number of viral copies(<138 copies/mL). A negative result must be combined with clinical observations, patient history, and epidemiological information. The expected result is Negative.  Fact Sheet for Patients:  EntrepreneurPulse.com.au  Fact Sheet for Healthcare Providers:  IncredibleEmployment.be  This test is no t yet approved or cleared by the [place] FDA and  has been  authorized for detection and/or diagnosis of SARS-CoV-2 by FDA under an Emergency Use Authorization (EUA). This EUA will remain  in effect (meaning this test can be used) for the duration of the COVID-19 declaration under Section 564(b)(1) of the Act, 21 U.S.C.section 360bbb-3(b)(1), unless the authorization is terminated  or revoked sooner.       Influenza A by PCR NEGATIVE NEGATIVE Final   Influenza B by PCR NEGATIVE NEGATIVE Final    Comment: (NOTE) The Xpert Xpress SARS-CoV-2/FLU/RSV plus assay is intended as an aid in the diagnosis of influenza from Nasopharyngeal swab specimens and should not be used as a sole basis for treatment. Nasal washings and aspirates are unacceptable for Xpert Xpress SARS-CoV-2/FLU/RSV testing.  Fact Sheet for Patients: EntrepreneurPulse.com.au  Fact Sheet for Healthcare Providers: IncredibleEmployment.be  This test is not yet approved or cleared by the [place] FDA and has been authorized for detection and/or diagnosis of SARS-CoV-2 by FDA under an Emergency Use Authorization (EUA). This EUA will remain in effect (meaning this test can be used) for the duration of the COVID-19 declaration under Section 564(b)(1) of the Act, 21 U.S.C. section 360bbb-3(b)(1), unless the authorization is terminated or revoked.  Performed at [facility], [place] [address] [place], [place] [address]      Time coordinating discharge: Over 30 minutes  SIGNED:   Little Ishikawa, DO [facility] 07/21/2020, 12:46 PM Pager   If 7PM-7AM, please contact night-coverage [email or web address]

## 2020-07-21 NOTE — Progress Notes (Signed)
[facility] Progress Note  CC:  Elevated LFTs and biliary stricture  Subjective:  Feels much better.  No complaints today.  Objective:  Vital signs in last 24 hours: Temp:  [97.6 F (36.4 C)-98.4 F (36.9 C)] 98.2 F (36.8 C) (05/07 0527) Pulse Rate:  [52-77] 65 (05/07 0527) Resp:  [17-18] 18 (05/07 0527) BP: (112-163)/(54-90) 123/74 (05/07 0527) SpO2:  [95 %-100 %] 97 % (05/07 0527) Last BM Date: 07/19/20 General:  Alert, Well-developed, in NAD Heart:  Regular rate and rhythm; no murmurs Pulm:  CTAB.  No W/R/R. Abdomen:  Soft, non-distended.  BS present.  Non-tender. Extremities:  Without edema. Neurologic:  Alert and oriented x 4;  grossly normal neurologically. Psych:  Alert and cooperative. Normal mood and affect.  Intake/Output from previous day: 05/06 0701 - 05/07 0700 In: 3540 [P.O.:1440; I.V.:2000; IV Piggyback:100] Out: 900 [Urine:900]  Lab Results: Recent Labs    07/19/20 1755 07/20/20 0338 07/21/20 0525  WBC 14.9* 13.1* 11.9*  HGB 15.8 12.6* 10.4*  HCT 46.6 37.6* 30.2*  PLT 321 255 181   BMET Recent Labs    07/19/20 1755 07/20/20 0338 07/21/20 0525  NA 137 139 135  K 4.5 4.3 4.5  CL 103 105 103  CO2 20* 23 22  GLUCOSE 95 97 123*  BUN 22 23 26*  CREATININE 1.24 1.48* 1.56*  CALCIUM 9.8 9.3 9.0   LFT Recent Labs    07/21/20 0525  PROT 5.9*  ALBUMIN 3.4*  AST 46*  ALT 131*  ALKPHOS 153*  BILITOT 0.7   CT ABDOMEN PELVIS W CONTRAST  Result Date: 07/19/2020 CLINICAL DATA:  Right upper quadrant pain and nausea. Chronic pancreatitis. EXAM: CT ABDOMEN AND PELVIS WITH CONTRAST TECHNIQUE: Multidetector CT imaging of the abdomen and pelvis was performed using the standard protocol following bolus administration of intravenous contrast. CONTRAST:  138mL OMNIPAQUE IOHEXOL 300 MG/ML  SOLN COMPARISON:  02/02/2020 FINDINGS: Lower Chest: No acute findings. Hepatobiliary: No hepatic masses identified. A few tiny sub-cm hepatic cysts  remain stable. New mild diffuse gallbladder wall thickening and distension are noted. Mild diffuse intrahepatic biliary ductal dilatation is seen which is increased since previous study, with common bile duct now measuring 9 mm in diameter compared to 5 mm previously. Smooth stricture of the intrapancreatic portion of the distal common bile duct seen. Pancreas: Moderate diffuse pancreatic ductal dilatation shows increase since previous study. Increased calcifications are seen in the pancreatic head, consistent with chronic pancreatitis. There also several new rim enhancing cystic lesions within the pancreatic head, largest measuring 2.7 cm on image 26/2. No acute peripancreatic inflammatory changes are seen. Spleen: Within normal limits in size and appearance. Adrenals/Urinary Tract: No masses identified. Bilateral renal cysts show no significant change. No evidence of ureteral calculi or hydronephrosis. Stomach/Bowel: No evidence of obstruction, inflammatory process or abnormal fluid collections. Normal appendix visualized. Vascular/Lymphatic: No pathologically enlarged lymph nodes. No acute vascular findings. Aortic atherosclerotic calcification noted. Reproductive:  Stable mildly enlarged prostate. Other:  None. Musculoskeletal:  No suspicious bone lesions identified. IMPRESSION: Increased diffuse biliary ductal dilatation, with smooth stricture of distal common bile duct, most likely due to chronic pancreatitis involving the pancreatic head. No pancreatic mass identified. Several new rim enhancing cystic lesions within the pancreatic head, largest measuring 2.7 cm, consistent with pancreatic pseudocysts. Mild diffuse gallbladder wall thickening and distension, which are nonspecific. These findings could be due to chronic pancreatitis, however cholecystitis cannot be excluded. Aortic Atherosclerosis (ICD10-I70.0). Electronically Signed   By: Jenny Reichmann  Tereso Newcomer M.D.   On: 07/19/2020 21:07   DG ERCP BILIARY &  PANCREATIC DUCTS  Result Date: 07/20/2020 CLINICAL DATA:  Biliary obstruction EXAM: ERCP TECHNIQUE: Multiple spot images obtained with the fluoroscopic device and submitted for interpretation post-procedure. FLUOROSCOPY TIME:  Fluoroscopy Time:  4 minutes 48 seconds Radiation Exposure Index (if provided by the fluoroscopic device): 91.5 mGy COMPARISON:  None. FINDINGS: A total of 8 intraoperative spot images are submitted for review. The images demonstrate a flexible endoscope in the descending duodenum with wire cannulation of the main pancreatic duct followed by wire cannulation of the common bile duct. Subsequent cholangiogram demonstrates intra and extrahepatic biliary ductal dilatation with high-grade stenosis/stricture of the distal common bile duct. The final images demonstrate placement of a plastic biliary stent. IMPRESSION: 1. Moderate intra and extrahepatic biliary ductal dilatation with a moderate length stenosis of the mid and distal common bile duct. 2. Placement of plastic biliary stent. These images were submitted for radiologic interpretation only. Please see the procedural report for the amount of contrast and the fluoroscopy time utilized. Electronically Signed   By: Jacqulynn Cadet M.D.   On: 07/20/2020 12:33   US ABDOMEN LIMITED RUQ (LIVER/GB)  Result Date: 07/19/2020 CLINICAL DATA:  Right upper quadrant pain EXAM: ULTRASOUND ABDOMEN LIMITED RIGHT UPPER QUADRANT COMPARISON:  06/18/2017 FINDINGS: Gallbladder: Gallbladder sludge. Mild wall thickening measuring 3.4 mm. Positive sonographic Murphy's sign. Common bile duct: Diameter: Dilated measuring 13.8 mm. On CT from 02/02/2020 the common bile duct was measured at 5 mm. Liver: No focal lesion identified. Within normal limits in parenchymal echogenicity. Portal vein is patent on color Doppler imaging with normal direction of blood flow towards the liver. Other: 2 cm cyst identified within the right kidney. IMPRESSION: 1. Gallbladder wall  thickening, gallbladder sludge and sonographic Murphy's sign. Findings may reflect acalculous cholecystitis. 2. Dilated common bile duct measuring 13.8 mm in diameter. Electronically Signed   By: Kerby Moors M.D.   On: 07/19/2020 18:19   Assessment / Plan: 1) Acute exacerbation of pancreatitis. 2) Chronic pancreatitis. 3) Biliary stricture - benign appearing. 4) Biliary sludge. 5) ? Acalculous cholecystitis.  S/P ERCP on 5/6 with Dr. Benson Norway: - The major papilla appeared normal. - A biliary sphincterotomy was performed. - One stent was placed into the common bile duct.  LFTs down significantly.  Lipase normal.    - Surgical consultation for lap chole. - Repeat ERCP in 3 months for repeat stent insertion to increase the diameter of the stricture.  Brushings at the time of the repeat ERCP. -Trend labs. -Continue abx, zosyn, for now. -GI signing off.  Call with questions.   LOS: 2 days   Patrick Mack. Teagyn Fishel  07/21/2020, 9:04 AM

## 2020-07-23 ENCOUNTER — Encounter (HOSPITAL_COMMUNITY): Payer: Self-pay | Admitting: Gastroenterology

## 2022-12-26 IMAGING — CT CT ABD-PELV W/ CM
2 of 5 series · 16 of 46 positions shown, 18 images · IV contrast (omnipaque)
Comparison: 02/02/2020

CLINICAL DATA: Right upper quadrant pain and nausea. Chronic
pancreatitis.

EXAM:
CT ABDOMEN AND PELVIS WITH CONTRAST
TECHNIQUE: Multidetector CT imaging of the abdomen and pelvis was performed
using the standard protocol following bolus administration of
intravenous contrast.
CONTRAST:  100mL OMNIPAQUE IOHEXOL 300 MG/ML  SOLN

[Series 2: axial st · axial · 0.79mm/px · z∈[-384,-14]mm · 13 of 88 slices shown, 15 images]
[im 7/88  soft-tissue]
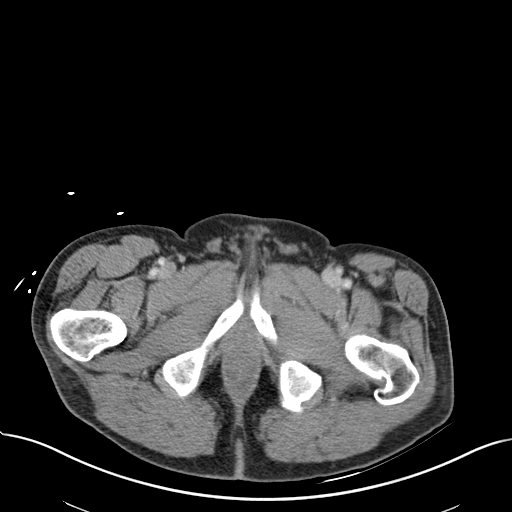
[im 7/88  bone]
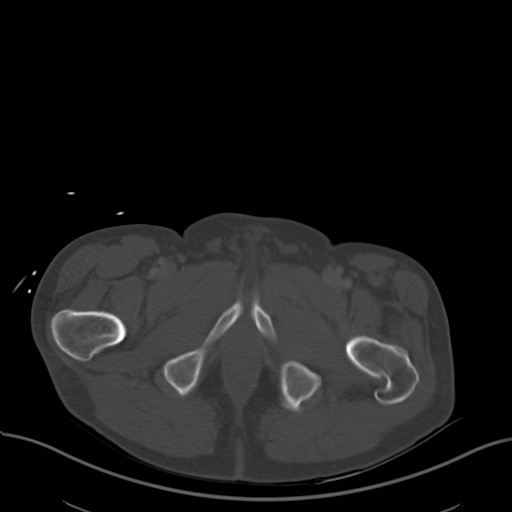
[im 13/88  soft-tissue]
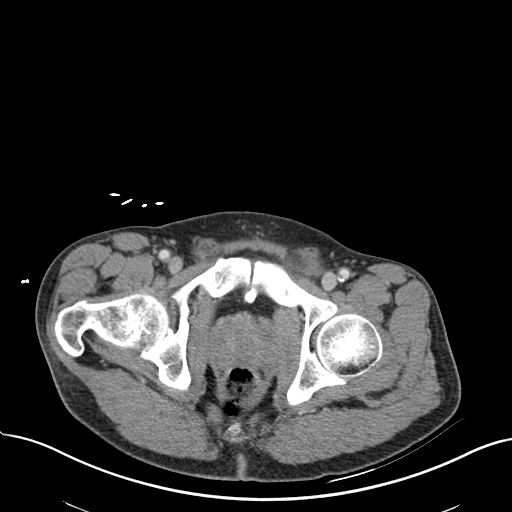
[im 19/88  soft-tissue]
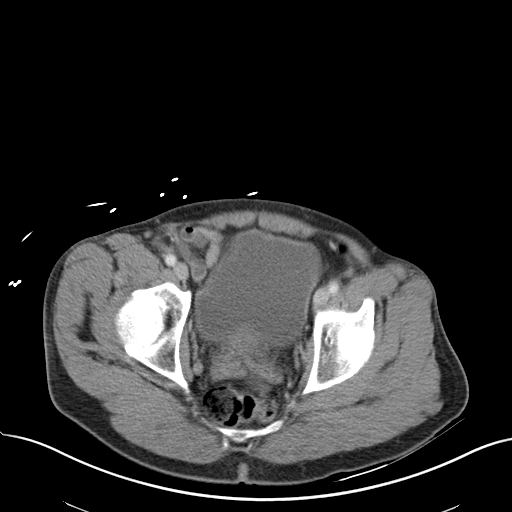
[im 25/88  soft-tissue]
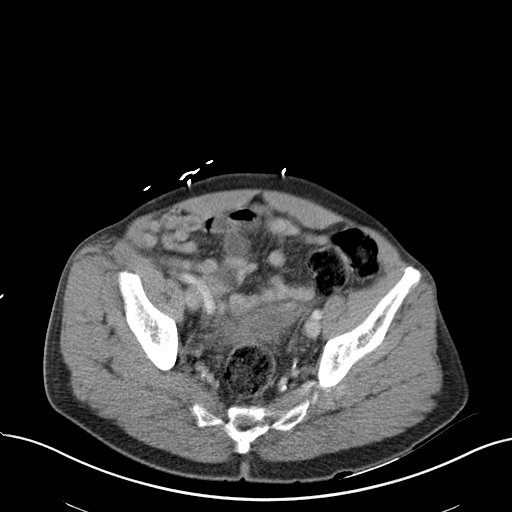
[im 32/88  soft-tissue]
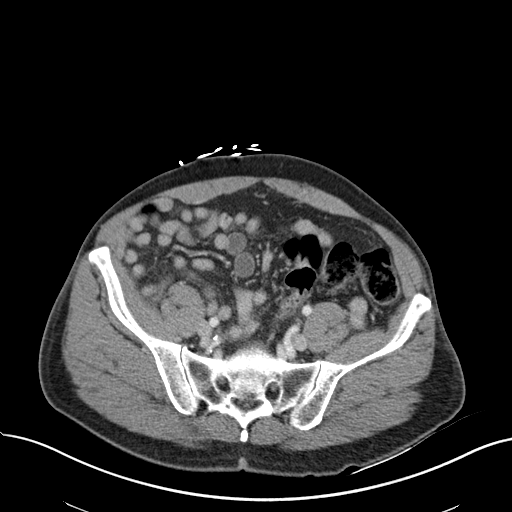
[im 38/88  soft-tissue]
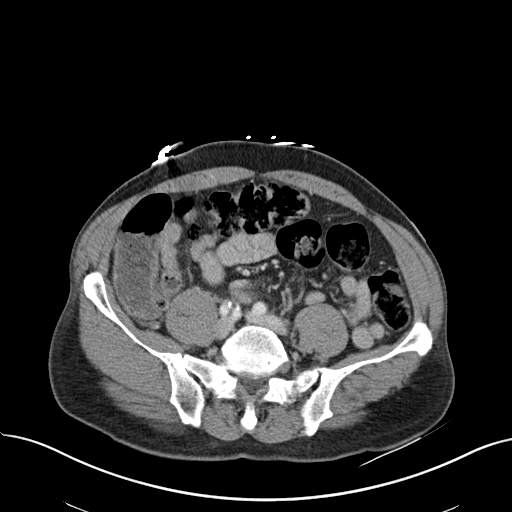
[im 44/88  soft-tissue]
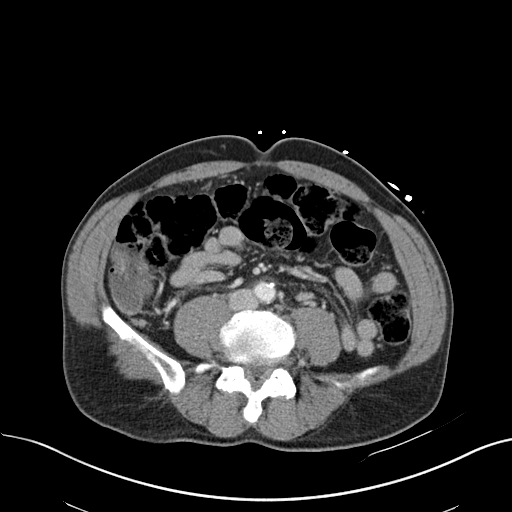
[im 50/88  soft-tissue]
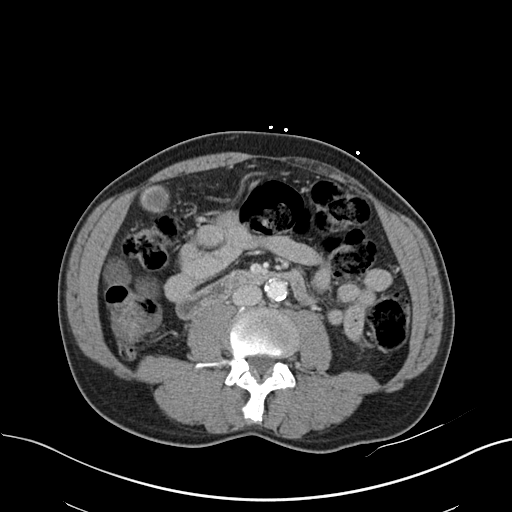
[im 56/88  soft-tissue]
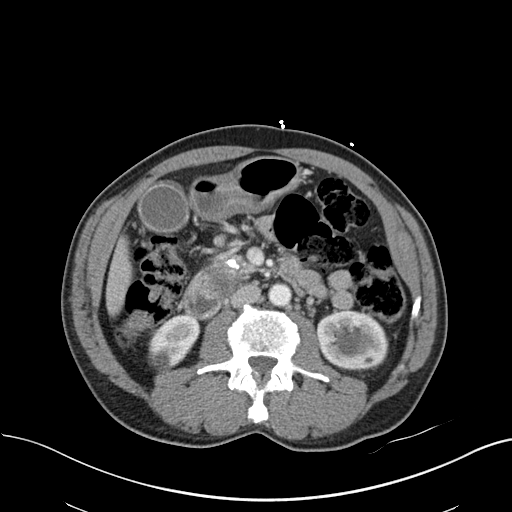
[im 56/88  bone]
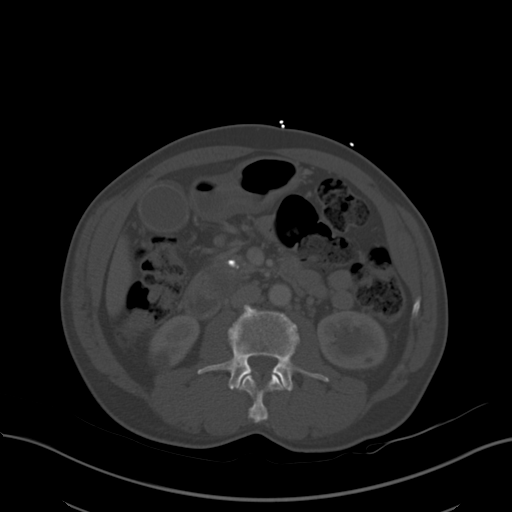
[im 63/88  soft-tissue]
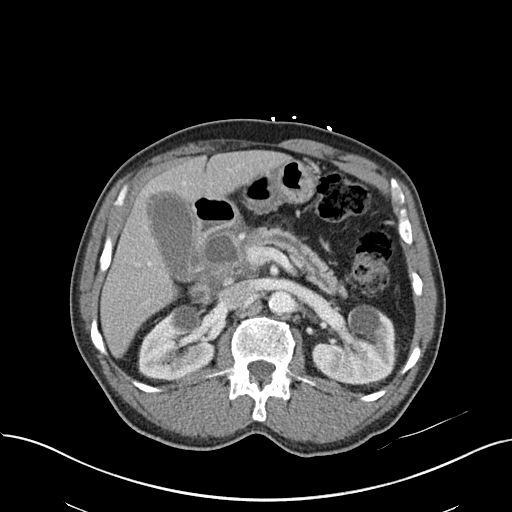
[im 69/88  soft-tissue]
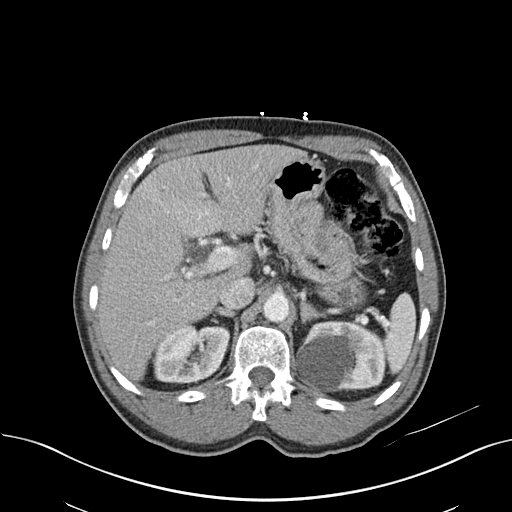
[im 75/88  soft-tissue]
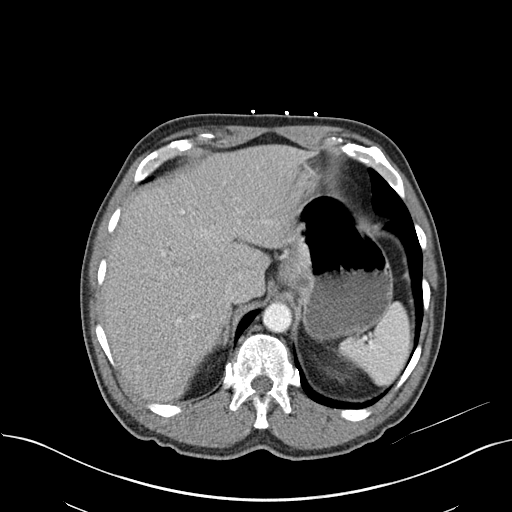
[im 81/88  soft-tissue]
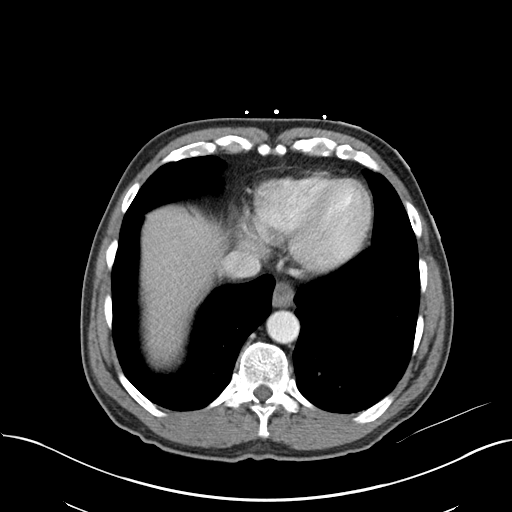

[Series 5: coronal st · coronal · 0.83mm/px · 3 of 141 slices shown]
[im 47/141  soft-tissue]
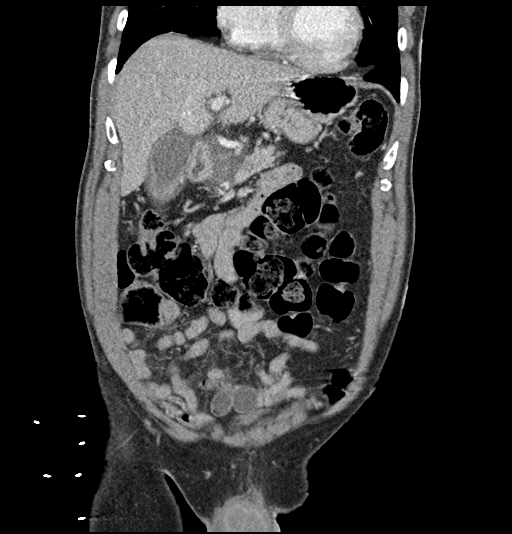
[im 63/141  soft-tissue]
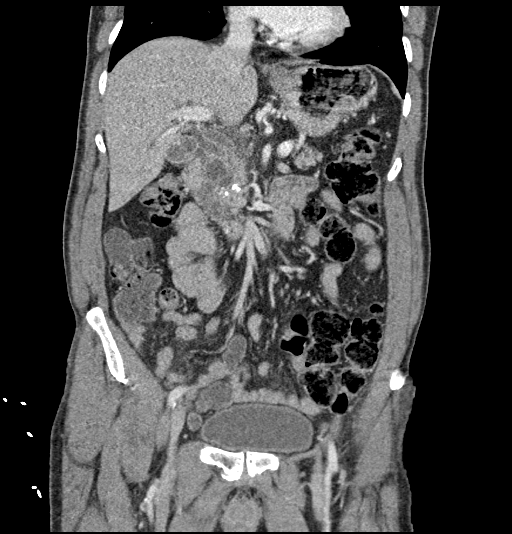
[im 78/141  soft-tissue]
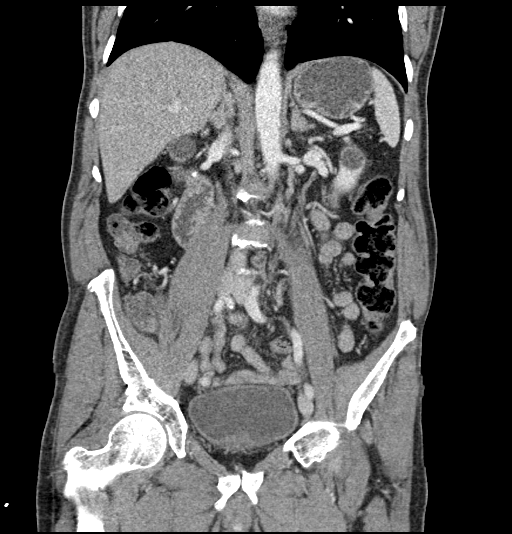

[16 of 46 positions shown; findings below may reference images not displayed]

FINDINGS: Lower Chest: No acute findings.

Hepatobiliary: No hepatic masses identified. A few tiny sub-cm
hepatic cysts remain stable. New mild diffuse gallbladder wall
thickening and distension are noted. Mild diffuse intrahepatic
biliary ductal dilatation is seen which is increased since previous
study, with common bile duct now measuring 9 mm in diameter compared
to 5 mm previously. Smooth stricture of the intrapancreatic portion
of the distal common bile duct seen.

Pancreas: Moderate diffuse pancreatic ductal dilatation shows
increase since previous study. Increased calcifications are seen in
the pancreatic head, consistent with chronic pancreatitis. There
also several new rim enhancing cystic lesions within the pancreatic
head, largest measuring 2.7 cm on image [DATE]. No acute
peripancreatic inflammatory changes are seen.

Spleen: Within normal limits in size and appearance.

Adrenals/Urinary Tract: No masses identified. Bilateral renal cysts
show no significant change. No evidence of ureteral calculi or
hydronephrosis.

Stomach/Bowel: No evidence of obstruction, inflammatory process or
abnormal fluid collections. Normal appendix visualized.

Vascular/Lymphatic: No pathologically enlarged lymph nodes. No acute
vascular findings. Aortic atherosclerotic calcification noted.

Reproductive:  Stable mildly enlarged prostate.

Other:  None.

Musculoskeletal:  No suspicious bone lesions identified.
IMPRESSION: Increased diffuse biliary ductal dilatation, with smooth stricture
of distal common bile duct, most likely due to chronic pancreatitis
involving the pancreatic head. No pancreatic mass identified.

Several new rim enhancing cystic lesions within the pancreatic head,
largest measuring 2.7 cm, consistent with pancreatic pseudocysts.

Mild diffuse gallbladder wall thickening and distension, which are
nonspecific. These findings could be due to chronic pancreatitis,
however cholecystitis cannot be excluded.

Aortic Atherosclerosis ([id number]).
